# Patient Record
Sex: Female | Born: 1985 | Race: White | Hispanic: No | Marital: Single | Smoking: Current every day smoker
Health system: Southern US, Community
[De-identification: ages and names within clinical notes are randomized; demographics above are authoritative.]

## PROBLEM LIST (undated history)

## (undated) DIAGNOSIS — I38 Endocarditis, valve unspecified: Secondary | ICD-10-CM

## (undated) DIAGNOSIS — F419 Anxiety disorder, unspecified: Secondary | ICD-10-CM

## (undated) DIAGNOSIS — F32A Depression, unspecified: Secondary | ICD-10-CM

## (undated) DIAGNOSIS — F329 Major depressive disorder, single episode, unspecified: Secondary | ICD-10-CM

## (undated) HISTORY — PX: MOUTH SURGERY: SHX715

## (undated) HISTORY — DX: Endocarditis, valve unspecified: I38

## (undated) HISTORY — DX: Anxiety disorder, unspecified: F41.9

---
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
## 2006-11-16 ENCOUNTER — Other Ambulatory Visit: Admission: RE | Admit: 2006-11-16 | Discharge: 2006-11-16 | Payer: Self-pay | Admitting: Internal Medicine

## 2007-05-12 ENCOUNTER — Emergency Department (HOSPITAL_COMMUNITY): Admission: EM | Admit: 2007-05-12 | Discharge: 2007-05-12 | Payer: Self-pay | Admitting: Emergency Medicine

## 2008-04-14 ENCOUNTER — Other Ambulatory Visit: Admission: RE | Admit: 2008-04-14 | Discharge: 2008-04-14 | Payer: Self-pay | Admitting: Obstetrics and Gynecology

## 2008-04-23 ENCOUNTER — Emergency Department (HOSPITAL_COMMUNITY): Admission: EM | Admit: 2008-04-23 | Discharge: 2008-04-23 | Payer: Self-pay | Admitting: Emergency Medicine

## 2008-11-23 ENCOUNTER — Emergency Department (HOSPITAL_COMMUNITY): Admission: EM | Admit: 2008-11-23 | Discharge: 2008-11-23 | Payer: Self-pay | Admitting: Emergency Medicine

## 2008-11-29 ENCOUNTER — Ambulatory Visit: Payer: Self-pay | Admitting: Obstetrics and Gynecology

## 2008-11-29 ENCOUNTER — Inpatient Hospital Stay (HOSPITAL_COMMUNITY): Admission: AD | Admit: 2008-11-29 | Discharge: 2008-12-02 | Payer: Self-pay | Admitting: Family Medicine

## 2010-01-09 ENCOUNTER — Emergency Department (HOSPITAL_COMMUNITY): Admission: EM | Admit: 2010-01-09 | Discharge: 2010-01-09 | Payer: Self-pay | Admitting: Emergency Medicine

## 2010-08-06 ENCOUNTER — Encounter: Payer: Self-pay | Admitting: General Surgery

## 2010-10-02 LAB — URINE MICROSCOPIC-ADD ON

## 2010-10-02 LAB — URINALYSIS, ROUTINE W REFLEX MICROSCOPIC
Bilirubin Urine: NEGATIVE
Glucose, UA: NEGATIVE mg/dL
Nitrite: NEGATIVE
Protein, ur: 100 mg/dL — AB
Specific Gravity, Urine: >1.03 — ABNORMAL HIGH (ref 1.005–1.030)
Urobilinogen, UA: 0.2 mg/dL (ref 0.0–1.0)
pH: 6.5 (ref 5.0–8.0)

## 2010-10-02 LAB — PREGNANCY, URINE: Preg Test, Ur: POSITIVE

## 2010-10-25 LAB — CBC
HCT: 21.8 % — ABNORMAL LOW (ref 36.0–46.0)
HCT: 34.8 % — ABNORMAL LOW (ref 36.0–46.0)
Hemoglobin: 12.2 g/dL (ref 12.0–15.0)
Hemoglobin: 7.7 g/dL — CL (ref 12.0–15.0)
MCHC: 35.1 g/dL (ref 30.0–36.0)
MCHC: 35.2 g/dL (ref 30.0–36.0)
MCV: 93.8 fL (ref 78.0–100.0)
MCV: 94.6 fL (ref 78.0–100.0)
Platelets: 252 10*3/uL (ref 150–400)
Platelets: 372 10*3/uL (ref 150–400)
RBC: 2.31 MIL/uL — ABNORMAL LOW (ref 3.87–5.11)
RBC: 3.71 MIL/uL — ABNORMAL LOW (ref 3.87–5.11)
RDW: 12.7 % (ref 11.5–15.5)
RDW: 13.1 % (ref 11.5–15.5)
WBC: 18.1 10*3/uL — ABNORMAL HIGH (ref 4.0–10.5)
WBC: 18.9 10*3/uL — ABNORMAL HIGH (ref 4.0–10.5)

## 2010-10-25 LAB — RPR: RPR Ser Ql: NONREACTIVE

## 2011-03-14 ENCOUNTER — Emergency Department (HOSPITAL_COMMUNITY)
Admission: EM | Admit: 2011-03-14 | Discharge: 2011-03-14 | Disposition: A | Discharge status: Home / Self Care | Payer: Self-pay | Attending: Emergency Medicine | Admitting: Emergency Medicine

## 2011-03-14 DIAGNOSIS — R319 Hematuria, unspecified: Secondary | ICD-10-CM | POA: Insufficient documentation

## 2011-03-14 DIAGNOSIS — N3000 Acute cystitis without hematuria: Secondary | ICD-10-CM | POA: Insufficient documentation

## 2011-03-14 DIAGNOSIS — F17200 Nicotine dependence, unspecified, uncomplicated: Secondary | ICD-10-CM | POA: Insufficient documentation

## 2011-03-14 LAB — URINALYSIS, ROUTINE W REFLEX MICROSCOPIC
Bilirubin Urine: NEGATIVE
Ketones, ur: NEGATIVE mg/dL
Nitrite: NEGATIVE
Specific Gravity, Urine: >1.03 — ABNORMAL HIGH (ref 1.005–1.030)
pH: 6.5 (ref 5.0–8.0)

## 2011-03-14 LAB — URINE MICROSCOPIC-ADD ON

## 2011-03-14 MED ORDER — PHENAZOPYRIDINE HCL 95 MG PO TABS: ORAL_TABLET | ORAL | Status: DC

## 2011-03-14 MED ORDER — CEPHALEXIN 500 MG PO CAPS
500.0000 mg | ORAL_CAPSULE | Freq: Once | ORAL | Status: AC
  Administered 2011-03-14: 500 mg via ORAL
  Filled 2011-03-14: qty 1

## 2011-03-14 MED ORDER — CEPHALEXIN 500 MG PO CAPS: 500.0000 mg | ORAL_CAPSULE | Freq: Three times a day (TID) | ORAL | Status: AC

## 2011-03-14 NOTE — ED Notes (Signed)
Painful urination

## 2011-03-14 NOTE — ED Provider Notes (Signed)
Scribed for Ward Givens, MD, the patient was seen in room [facility] . This chart was scribed by Ellie Lunch. This patient's care was started at 4:07 PM.   CSN: [id number] Arrival date & time: 03/14/2011  3:57 PM  Chief Complaint  Patient presents with  . Hematuria   HPI GENIFER LAZENBY is a 25 y.o. female who presents to the Emergency Department complaining of  Hematuria and pelvic pain. Pain is described as constant that waxes and wanes. When Pt stands it feels like "her bladder is going to fall out." At it worst the pain feels like a contraction. When she urinates she sees little blood clots. Pt also reports has dysuria, increased frequency, decreased urine volume, difficulty urinating, and urgency. Pt denies n/v, fever, flank or pack pain. There are no other associated symptoms and no other alleviating or aggravating factors.  No PCP.  History reviewed. No pertinent past medical history.  History reviewed. No pertinent past surgical history.  MEDICATIONS:  Previous Medications   ETONOGESTREL (IMPLANON [place])    Inject into the skin.       ALLERGIES:  Allergies as of 03/14/2011 - Review Complete 03/14/2011  Allergen Reaction Noted  . Coconut flavor Itching and Other (See Comments) 03/14/2011  . Hydrocodone Nausea Only 03/14/2011     History reviewed. No pertinent family history.  History  Substance Use Topics  . Smoking status: Current Everyday Smoker  . Smokeless tobacco: Not on file  . Alcohol Use: Yes  employed here at [employer]  OB History    Grav Para Term Preterm Abortions TAB SAB Ect Mult Living                  Review of Systems  Constitutional: Negative for fever.  Gastrointestinal: Negative for nausea, vomiting and diarrhea.  Genitourinary: Positive for dysuria, urgency, frequency, decreased urine volume and difficulty urinating. Negative for flank pain.  Musculoskeletal: Negative for back pain.  All other systems reviewed and are negative.    Physical Exam    BP 109/80  Pulse 95  Temp(Src) 97.5 F (36.4 C) (Oral)  Resp 20  Ht 5\' 5"  (1.651 m)  Wt 150 lb (68.04 kg)  BMI 24.96 kg/m2  SpO2 98%  LMP 01/15/2011  Physical Exam  Constitutional: She appears well-developed and well-nourished.  HENT:  Head: Normocephalic and atraumatic.  Eyes: Conjunctivae and EOM are normal. Pupils are equal, round, and reactive to light.  Neck: Normal range of motion. Neck supple.  Cardiovascular: Normal rate, regular rhythm and normal heart sounds.   Pulmonary/Chest: Effort normal and breath sounds normal.  Abdominal: Soft. There is tenderness in the suprapubic area. There is no rebound, no guarding and no CVA tenderness.  Musculoskeletal:       Pt has no acute abnormality.   Neurological: She is alert. No cranial nerve deficit.  Skin: Skin is warm, dry and intact.  Psychiatric: She has a normal mood and affect. Her speech is normal and behavior is normal.    Procedures  OTHER DATA REVIEWED: Nursing notes, vital signs, and past medical records reviewed.   DIAGNOSTIC STUDIES: Oxygen Saturation is 98% on room air, normal by my interpretation.    LABS:  Results for orders placed during the hospital encounter of 03/14/11  URINALYSIS, ROUTINE W REFLEX MICROSCOPIC      Component Value Range   Color, Urine YELLOW  YELLOW    Appearance HAZY (*) CLEAR    Specific Gravity, Urine >1.030 (*) 1.005 - 1.030  pH 6.5  5.0 - 8.0    Glucose, UA NEGATIVE  NEGATIVE (mg/dL)   Hgb urine dipstick LARGE (*) NEGATIVE    Bilirubin Urine NEGATIVE  NEGATIVE    Ketones, ur NEGATIVE  NEGATIVE (mg/dL)   Protein, ur 161 (*) NEGATIVE (mg/dL)   Urobilinogen, UA 1.0  0.0 - 1.0 (mg/dL)   Nitrite NEGATIVE  NEGATIVE    Leukocytes, UA SMALL (*) NEGATIVE   URINE MICROSCOPIC-ADD ON      Component Value Range   Squamous Epithelial / LPF FEW (*) RARE    WBC, UA 21-50  <3 (WBC/hpf)   RBC / HPF TOO NUMEROUS TO COUNT  <3 (RBC/hpf)   Bacteria, UA MANY (*) RARE    UA cw  hemorrhagic cystitis ED COURSE / COORDINATION OF CARE:  MDM:   IMPRESSION: Diagnoses that have been ruled out:  Diagnoses that are still under consideration:  Final diagnoses:  Acute cystitis  Hematuria    MEDICATIONS GIVEN IN THE E.D.  Medications  phenazopyridine (AZO-TABS) 95 MG tablet (not administered)  cephALEXin (KEFLEX) capsule 500 mg (not administered)  Etonogestrel (IMPLANON [place]) (not administered)  phenazopyridine (AZO-TABS) 95 MG tablet (not administered)  cephALEXin (KEFLEX) 500 MG capsule (not administered)    DISCHARGE MEDICATIONS: New Prescriptions   CEPHALEXIN (KEFLEX) 500 MG CAPSULE    Take 1 capsule (500 mg total) by mouth 3 (three) times daily.      I personally performed the services described in this documentation, which was scribed in my presence. The recorded information has been reviewed and considered.Devoria Albe, MD, Armando Gang    Ward Givens, MD 03/14/11 [phone]

## 2011-04-17 LAB — POCT I-STAT, CHEM 8
BUN: 6
Calcium, Ion: 1.19
Chloride: 103
Creatinine, Ser: 0.7
Glucose, Bld: 82
HCT: 36
Hemoglobin: 12.2
Potassium: 3.8
Sodium: 136
TCO2: 22

## 2011-04-17 LAB — URINALYSIS, ROUTINE W REFLEX MICROSCOPIC
Bilirubin Urine: NEGATIVE
Glucose, UA: NEGATIVE
Hgb urine dipstick: NEGATIVE
Ketones, ur: 15 — AB
Leukocytes, UA: NEGATIVE
Nitrite: NEGATIVE
Protein, ur: 100 — AB
Specific Gravity, Urine: >1.03 — ABNORMAL HIGH
Urobilinogen, UA: 0.2
pH: 6

## 2011-04-17 LAB — URINE MICROSCOPIC-ADD ON

## 2011-04-26 LAB — CBC
HCT: 36.9
Hemoglobin: 12.7
MCHC: 34.4
MCV: 90
Platelets: 244
RBC: 4.1
RDW: 12.8
WBC: 8.9

## 2011-04-26 LAB — PREGNANCY, URINE: Preg Test, Ur: NEGATIVE

## 2011-04-26 LAB — URINALYSIS, ROUTINE W REFLEX MICROSCOPIC
Bilirubin Urine: NEGATIVE
Glucose, UA: NEGATIVE
Ketones, ur: NEGATIVE
Leukocytes, UA: NEGATIVE
Nitrite: NEGATIVE
Protein, ur: NEGATIVE
Specific Gravity, Urine: <1.005 — ABNORMAL LOW
Urobilinogen, UA: 0.2
pH: 5.5

## 2011-04-26 LAB — DIFFERENTIAL
Basophils Absolute: 0
Basophils Relative: 0
Eosinophils Absolute: 0
Eosinophils Relative: 0
Lymphocytes Relative: 22
Lymphs Abs: 2
Monocytes Absolute: 0.3
Monocytes Relative: 4
Neutro Abs: 6.6
Neutrophils Relative %: 74

## 2011-04-26 LAB — RAPID URINE DRUG SCREEN, HOSP PERFORMED
Amphetamines: NOT DETECTED
Barbiturates: NOT DETECTED
Benzodiazepines: NOT DETECTED
Cocaine: POSITIVE — AB
Opiates: NOT DETECTED
Tetrahydrocannabinol: POSITIVE — AB

## 2011-04-26 LAB — URINE MICROSCOPIC-ADD ON

## 2011-04-26 LAB — COMPREHENSIVE METABOLIC PANEL
ALT: 23
AST: 27
Albumin: 3.8
Alkaline Phosphatase: 50
BUN: 6
CO2: 27
Calcium: 8.9
Chloride: 114 — ABNORMAL HIGH
Creatinine, Ser: 0.58
GFR calc Af Amer: >60
GFR calc non Af Amer: >60
Glucose, Bld: 115 — ABNORMAL HIGH
Potassium: 3.4 — ABNORMAL LOW
Sodium: 145
Total Bilirubin: 0.5
Total Protein: 6.3

## 2011-04-26 LAB — ETHANOL: Alcohol, Ethyl (B): 278 — ABNORMAL HIGH

## 2011-05-23 ENCOUNTER — Encounter (HOSPITAL_COMMUNITY): Payer: Self-pay | Admitting: Emergency Medicine

## 2011-05-23 ENCOUNTER — Emergency Department (HOSPITAL_COMMUNITY)
Admission: EM | Admit: 2011-05-23 | Discharge: 2011-05-23 | Disposition: A | Discharge status: Home / Self Care | Payer: Self-pay | Attending: Emergency Medicine | Admitting: Emergency Medicine

## 2011-05-23 DIAGNOSIS — K029 Dental caries, unspecified: Secondary | ICD-10-CM | POA: Insufficient documentation

## 2011-05-23 DIAGNOSIS — K047 Periapical abscess without sinus: Secondary | ICD-10-CM

## 2011-05-23 DIAGNOSIS — F17200 Nicotine dependence, unspecified, uncomplicated: Secondary | ICD-10-CM | POA: Insufficient documentation

## 2011-05-23 MED ORDER — AMOXICILLIN 250 MG PO CAPS
500.0000 mg | ORAL_CAPSULE | Freq: Once | ORAL | Status: AC
  Administered 2011-05-23: 500 mg via ORAL
  Filled 2011-05-23: qty 2

## 2011-05-23 MED ORDER — IBUPROFEN 800 MG PO TABS
800.0000 mg | ORAL_TABLET | Freq: Once | ORAL | Status: AC
  Administered 2011-05-23: 800 mg via ORAL
  Filled 2011-05-23: qty 1

## 2011-05-23 MED ORDER — AMOXICILLIN 500 MG PO CAPS: 500.0000 mg | ORAL_CAPSULE | Freq: Three times a day (TID) | ORAL | Status: AC

## 2011-05-23 MED ORDER — IBUPROFEN 800 MG PO TABS: 800.0000 mg | ORAL_TABLET | Freq: Three times a day (TID) | ORAL | Status: AC | PRN

## 2011-05-23 NOTE — ED Provider Notes (Signed)
History     CSN: [id number] Arrival date & time: 05/23/2011  2:36 PM   First MD Initiated Contact with Patient 05/23/11 1457      Chief Complaint  Patient presents with  . Dental Pain    (Consider location/radiation/quality/duration/timing/severity/associated sxs/prior treatment) Patient is a 25 y.o. female presenting with tooth pain. The history is provided by the patient.  Dental PainThe primary symptoms include mouth pain. Primary symptoms do not include headaches, fever, shortness of breath or sore throat. The symptoms began 3 to 5 days ago. The symptoms are worsening. The symptoms occur constantly.  Additional symptoms include: dental sensitivity to temperature, gum swelling and gum tenderness.    History reviewed. No pertinent past medical history.  Past Surgical History  Procedure Date  . Mouth surgery     Family History  Problem Relation Age of Onset  . Cancer Other     History  Substance Use Topics  . Smoking status: Current Everyday Smoker -- 0.5 packs/day for 5 years    Types: Cigarettes  . Smokeless tobacco: Never Used  . Alcohol Use: No    OB History    Grav Para Term Preterm Abortions TAB SAB Ect Mult Living   2 2 2       2       Review of Systems  Constitutional: Negative for fever.  HENT: Positive for dental problem. Negative for congestion, sore throat and neck pain.   Eyes: Negative.   Respiratory: Negative for chest tightness and shortness of breath.   Cardiovascular: Negative for chest pain.  Gastrointestinal: Negative for nausea and abdominal pain.  Genitourinary: Negative.   Musculoskeletal: Negative for joint swelling and arthralgias.  Skin: Negative.  Negative for rash and wound.  Neurological: Negative for dizziness, weakness, light-headedness, numbness and headaches.  Hematological: Negative.   Psychiatric/Behavioral: Negative.     Allergies  Coconut flavor and Hydrocodone  Home Medications   Current Outpatient Rx  Name Route  Sig Dispense Refill  . IBUPROFEN 200 MG PO TABS Oral Take 400 mg by mouth every 6 (six) hours as needed.      . AMOXICILLIN 500 MG PO CAPS Oral Take 1 capsule (500 mg total) by mouth 3 (three) times daily. 30 capsule 0  . IMPLANON [place] Subcutaneous Inject into the skin.      . IBUPROFEN 800 MG PO TABS Oral Take 1 tablet (800 mg total) by mouth every 8 (eight) hours as needed for pain. 20 tablet 0    BP 115/69  Pulse 89  Temp(Src) 98.7 F (37.1 C) (Oral)  Resp 20  Ht 5\' 5"  (1.651 m)  Wt 150 lb (68.04 kg)  BMI 24.96 kg/m2  SpO2 98%  LMP 04/21/2011  Physical Exam  Constitutional: She is oriented to person, place, and time. She appears well-developed and well-nourished. No distress.  HENT:  Head: Normocephalic and atraumatic.  Right Ear: Tympanic membrane and external ear normal.  Left Ear: Tympanic membrane and external ear normal.  Mouth/Throat: Oropharynx is clear and moist and mucous membranes are normal. No oral lesions. Dental caries present. No oropharyngeal exudate, posterior oropharyngeal edema, posterior oropharyngeal erythema or tonsillar abscesses.    Eyes: Conjunctivae are normal.  Neck: Normal range of motion. Neck supple.  Cardiovascular: Normal rate and normal heart sounds.   Pulmonary/Chest: Effort normal.  Abdominal: She exhibits no distension.  Musculoskeletal: Normal range of motion.  Lymphadenopathy:    She has no cervical adenopathy.  Neurological: She is alert and oriented to person, place,  and time.  Skin: Skin is warm and dry. No erythema.  Psychiatric: She has a normal mood and affect.    ED Course  Procedures (including critical care time)  Labs Reviewed - No data to display No results found.   1. Dental abscess       MDM  Dental caries with possible early abscess.  Amoxil,  Ibuprofen.  Dental referral sheet given.        Candis Musa, PA 05/23/11 1822  Candis Musa, PA 05/23/11 Rickey Primus

## 2011-05-23 NOTE — ED Notes (Signed)
Patient c/o intermittent left upper dental pain-progressively worse since yesterday. Patient c/o headache from dental pain.

## 2011-05-26 NOTE — ED Provider Notes (Signed)
Medical screening examination/treatment/procedure(s) were performed by non-physician practitioner and as supervising physician I was immediately available for consultation/collaboration.   Mechelle Pates L Umberto Pavek, MD 05/26/11 0737 

## 2011-10-09 ENCOUNTER — Encounter (HOSPITAL_COMMUNITY): Payer: Self-pay | Admitting: *Deleted

## 2011-10-09 ENCOUNTER — Emergency Department (HOSPITAL_COMMUNITY)
Admission: EM | Admit: 2011-10-09 | Discharge: 2011-10-09 | Disposition: A | Discharge status: Home / Self Care | Payer: Self-pay | Attending: Emergency Medicine | Admitting: Emergency Medicine

## 2011-10-09 DIAGNOSIS — F17200 Nicotine dependence, unspecified, uncomplicated: Secondary | ICD-10-CM | POA: Insufficient documentation

## 2011-10-09 DIAGNOSIS — N390 Urinary tract infection, site not specified: Secondary | ICD-10-CM | POA: Insufficient documentation

## 2011-10-09 LAB — URINALYSIS, ROUTINE W REFLEX MICROSCOPIC
Bilirubin Urine: NEGATIVE
Ketones, ur: 15 mg/dL — AB
Nitrite: NEGATIVE
Protein, ur: 30 mg/dL — AB
Specific Gravity, Urine: 1.015 (ref 1.005–1.030)
Urobilinogen, UA: 0.2 mg/dL (ref 0.0–1.0)

## 2011-10-09 LAB — URINE MICROSCOPIC-ADD ON

## 2011-10-09 MED ORDER — CEPHALEXIN 500 MG PO CAPS: 500.0000 mg | ORAL_CAPSULE | Freq: Four times a day (QID) | ORAL | Status: AC

## 2011-10-09 MED ORDER — CEFTRIAXONE SODIUM 1 G IJ SOLR
1.0000 g | Freq: Once | INTRAMUSCULAR | Status: AC
  Administered 2011-10-09: 1 g via INTRAMUSCULAR
  Filled 2011-10-09: qty 10

## 2011-10-09 MED ORDER — PHENAZOPYRIDINE HCL 95 MG PO TABS: ORAL_TABLET | ORAL | Status: DC

## 2011-10-09 NOTE — Discharge Instructions (Signed)
Please increase water and juices. Please have your urine rechecked in 10 days. Keflex four times daily until all taken. Pyridium three times daily with food.Urinary Tract Infection A urinary tract infection (UTI) is often caused by a germ (bacteria). A UTI is usually helped with medicine (antibiotics) that kills germs. Take all the medicine until it is gone. Do this even if you are feeling better. You are usually better in 7 to 10 days. HOME CARE   Drink enough water and fluids to keep your pee (urine) clear or pale yellow. Drink:   Cranberry juice.   Water.   Avoid:   Caffeine.   Tea.   Bubbly (carbonated) drinks.   Alcohol.   Only take medicine as told by your doctor.   To prevent further infections:   Pee often.   After pooping (bowel movement), women should wipe from front to back. Use each tissue only once.   Pee before and after having sex (intercourse).  Ask your doctor when your test results will be ready. Make sure you follow up and get your test results.  GET HELP RIGHT AWAY IF:   There is very bad back pain or lower belly (abdominal) pain.   You get the chills.   You have a fever.   Your baby is older than 3 months with a rectal temperature of 102 F (38.9 C) or higher.   Your baby is 79 months old or younger with a rectal temperature of 100.4 F (38 C) or higher.   You feel sick to your stomach (nauseous) or throw up (vomit).   There is continued burning with peeing.   Your problems are not better in 3 days. Return sooner if you are getting worse.  MAKE SURE YOU:   Understand these instructions.   Will watch your condition.   Will get help right away if you are not doing well or get worse.  Document Released: 12/20/2007 Document Revised: 06/22/2011 Document Reviewed: 12/20/2007 [facility] Patient Information 2012 [place], [place].

## 2011-10-09 NOTE — ED Notes (Signed)
Pt with no signs of reaction to abx injection.

## 2011-10-09 NOTE — ED Provider Notes (Signed)
History     CSN: [id number]  Arrival date & time 10/09/11  1517   First MD Initiated Contact with Patient 10/09/11 1545      Chief Complaint  Patient presents with  . Urinary Tract Infection    (Consider location/radiation/quality/duration/timing/severity/associated sxs/prior treatment) Patient is a 26 y.o. female presenting with urinary tract infection. The history is provided by the patient.  Urinary Tract Infection This is a new problem. The current episode started in the past 7 days. The problem occurs constantly. The problem has been unchanged. Associated symptoms include urinary symptoms. Pertinent negatives include no abdominal pain, arthralgias, chest pain, coughing, neck pain or vomiting. The symptoms are aggravated by nothing. Treatments tried: AZO. The treatment provided mild relief.    History reviewed. No pertinent past medical history.  Past Surgical History  Procedure Date  . Mouth surgery     Family History  Problem Relation Age of Onset  . Cancer Other     History  Substance Use Topics  . Smoking status: Current Everyday Smoker -- 0.5 packs/day for 5 years    Types: Cigarettes  . Smokeless tobacco: Never Used  . Alcohol Use: No    OB History    Grav Para Term Preterm Abortions TAB SAB Ect Mult Living   2 2 2       2       Review of Systems  Constitutional: Negative for activity change.       All ROS Neg except as noted in HPI  HENT: Negative for nosebleeds and neck pain.   Eyes: Negative for photophobia and discharge.  Respiratory: Negative for cough, shortness of breath and wheezing.   Cardiovascular: Negative for chest pain and palpitations.  Gastrointestinal: Negative for vomiting, abdominal pain and blood in stool.  Genitourinary: Positive for dysuria and flank pain. Negative for frequency and hematuria.  Musculoskeletal: Positive for back pain. Negative for arthralgias.  Skin: Negative.   Neurological: Negative for dizziness, seizures and  speech difficulty.  Psychiatric/Behavioral: Negative for hallucinations and confusion.    Allergies  Coconut flavor and Hydrocodone  Home Medications   Current Outpatient Rx  Name Route Sig Dispense Refill  . IMPLANON [place] Subcutaneous Inject into the skin.      . IBUPROFEN 200 MG PO TABS Oral Take 400 mg by mouth every 6 (six) hours as needed.        BP 105/82  Pulse 89  Temp(Src) 97.6 F (36.4 C) (Oral)  Resp 18  Ht 5\' 5"  (1.651 m)  Wt 140 lb (63.504 kg)  BMI 23.30 kg/m2  SpO2 98%  Physical Exam  Nursing note and vitals reviewed. Constitutional: She is oriented to person, place, and time. She appears well-developed and well-nourished.  Non-toxic appearance.  HENT:  Head: Normocephalic.  Right Ear: Tympanic membrane and external ear normal.  Left Ear: Tympanic membrane and external ear normal.  Eyes: EOM and lids are normal. Pupils are equal, round, and reactive to light.  Neck: Normal range of motion. Neck supple. Carotid bruit is not present.  Cardiovascular: Normal rate, regular rhythm, normal heart sounds, intact distal pulses and normal pulses.   Pulmonary/Chest: Breath sounds normal. No respiratory distress.  Abdominal: Soft. Bowel sounds are normal. There is no tenderness. There is no guarding.       Left CVAT.  Musculoskeletal: Normal range of motion.       Mid lumbar area tenderness to palpation .  Lymphadenopathy:       Head (right side): No submandibular  adenopathy present.       Head (left side): No submandibular adenopathy present.    She has no cervical adenopathy.  Neurological: She is alert and oriented to person, place, and time. She has normal strength. No cranial nerve deficit or sensory deficit.  Skin: Skin is warm and dry.  Psychiatric: She has a normal mood and affect. Her speech is normal.    ED Course  Procedures (including critical care time)   Labs Reviewed  URINALYSIS, ROUTINE W REFLEX MICROSCOPIC  PREGNANCY, URINE   No results  found.   Dx: UTI   MDM  I have reviewed nursing notes, vital signs, and all appropriate lab and imaging results for this patient. Urine preg is negative. UA is pos for UTI. Pt treated with keflex and pyridium. Pt to have urine rechecked in 10 days.       Kathie Dike, [place] 10/09/11 1640

## 2011-10-09 NOTE — ED Notes (Signed)
Pt presents to er with week hx of uti symptoms, lower back pain,

## 2011-10-11 LAB — URINE CULTURE
Colony Count: 50000
Culture  Setup Time: 201303251840

## 2011-10-13 NOTE — ED Provider Notes (Signed)
Medical screening examination/treatment/procedure(s) were performed by non-physician practitioner and as supervising physician I was immediately available for consultation/collaboration.   Shelda Jakes, MD 10/13/11 [phone]

## 2011-11-14 ENCOUNTER — Encounter (HOSPITAL_COMMUNITY): Payer: Self-pay

## 2011-11-14 ENCOUNTER — Emergency Department (HOSPITAL_COMMUNITY)
Admission: EM | Admit: 2011-11-14 | Discharge: 2011-11-14 | Disposition: A | Discharge status: Home / Self Care | Payer: Self-pay | Attending: Emergency Medicine | Admitting: Emergency Medicine

## 2011-11-14 DIAGNOSIS — R259 Unspecified abnormal involuntary movements: Secondary | ICD-10-CM | POA: Insufficient documentation

## 2011-11-14 DIAGNOSIS — K089 Disorder of teeth and supporting structures, unspecified: Secondary | ICD-10-CM | POA: Insufficient documentation

## 2011-11-14 DIAGNOSIS — F17200 Nicotine dependence, unspecified, uncomplicated: Secondary | ICD-10-CM | POA: Insufficient documentation

## 2011-11-14 DIAGNOSIS — K029 Dental caries, unspecified: Secondary | ICD-10-CM | POA: Insufficient documentation

## 2011-11-14 DIAGNOSIS — K0889 Other specified disorders of teeth and supporting structures: Secondary | ICD-10-CM

## 2011-11-14 MED ORDER — TRAMADOL HCL 50 MG PO TABS: 50.0000 mg | ORAL_TABLET | Freq: Four times a day (QID) | ORAL | Status: AC | PRN

## 2011-11-14 MED ORDER — AMOXICILLIN 500 MG PO CAPS: 500.0000 mg | ORAL_CAPSULE | Freq: Three times a day (TID) | ORAL | Status: AC

## 2011-11-14 MED ORDER — OXYCODONE-ACETAMINOPHEN 5-325 MG PO TABS
1.0000 | ORAL_TABLET | Freq: Once | ORAL | Status: AC
  Administered 2011-11-14: 1 via ORAL
  Filled 2011-11-14: qty 1

## 2011-11-14 MED ORDER — AMOXICILLIN 250 MG PO CAPS
500.0000 mg | ORAL_CAPSULE | Freq: Once | ORAL | Status: AC
  Administered 2011-11-14: 500 mg via ORAL
  Filled 2011-11-14: qty 2

## 2011-11-14 NOTE — ED Notes (Signed)
Right lower dental pain x 2 days.

## 2011-11-14 NOTE — ED Provider Notes (Signed)
History     CSN: [id number]  Arrival date & time 11/14/11  2307   First MD Initiated Contact with Patient 11/14/11 2309      Chief Complaint  Patient presents with  . Dental Pain    (Consider location/radiation/quality/duration/timing/severity/associated sxs/prior treatment) Patient is a 26 y.o. female presenting with tooth pain. The history is provided by the patient.  Dental PainThe primary symptoms include mouth pain. Primary symptoms do not include headaches, fever, shortness of breath, sore throat, angioedema or cough. The symptoms began 2 days ago. The symptoms are unchanged. The symptoms are new. The symptoms occur constantly.  Mouth pain began 24 -48 hours ago. Mouth pain occurs constantly. Mouth pain is unchanged. Affected locations include: teeth and gum(s).  Additional symptoms include: dental sensitivity to temperature and gum tenderness. Additional symptoms do not include: gum swelling, purulent gums, trismus, jaw pain, facial swelling, trouble swallowing, pain with swallowing, drooling, ear pain and swollen glands. Medical issues include: smoking and periodontal disease.    History reviewed. No pertinent past medical history.  Past Surgical History  Procedure Date  . Mouth surgery     Family History  Problem Relation Age of Onset  . Cancer Other     History  Substance Use Topics  . Smoking status: Current Everyday Smoker -- 0.5 packs/day for 5 years    Types: Cigarettes  . Smokeless tobacco: Never Used  . Alcohol Use: No    OB History    Grav Para Term Preterm Abortions TAB SAB Ect Mult Living   2 2 2       2       Review of Systems  Constitutional: Negative for fever and appetite change.  HENT: Positive for dental problem. Negative for ear pain, congestion, sore throat, facial swelling, drooling, trouble swallowing, neck pain and neck stiffness.   Eyes: Negative for pain and visual disturbance.  Respiratory: Negative for cough and shortness of breath.    Neurological: Negative for dizziness, facial asymmetry and headaches.  Hematological: Negative for adenopathy.  All other systems reviewed and are negative.    Allergies  Coconut flavor and Hydrocodone  Home Medications   Current Outpatient Rx  Name Route Sig Dispense Refill  . IMPLANON [place] Subcutaneous Inject into the skin.      . IBUPROFEN 200 MG PO TABS Oral Take 400 mg by mouth every 6 (six) hours as needed.      Marland Kitchen PHENAZOPYRIDINE HCL 95 MG PO TABS  1 po tid with food 10 tablet 0  . AZO TABS PO Oral Take 1-2 tablets by mouth as needed. For urinary pain relief      BP 137/75  Pulse 92  Temp 98.2 F (36.8 C)  Resp 18  Ht 5\' 5"  (1.651 m)  Wt 145 lb (65.772 kg)  BMI 24.13 kg/m2  SpO2 100%  Physical Exam  Nursing note and vitals reviewed. Constitutional: She is oriented to person, place, and time. She appears well-developed and well-nourished. No distress.  HENT:  Head: Normocephalic and atraumatic. There is trismus in the jaw.  Right Ear: Tympanic membrane and ear canal normal.  Left Ear: Tympanic membrane and ear canal normal.  Mouth/Throat: Uvula is midline, oropharynx is clear and moist and mucous membranes are normal. Dental caries present. No dental abscesses or uvula swelling.         Dental caries of the right lower third molar.  ttp of the surrounding gums.    Neck: Normal range of motion. Neck supple.  Cardiovascular: Normal rate, regular rhythm and normal heart sounds.   Pulmonary/Chest: Effort normal and breath sounds normal. No respiratory distress.  Musculoskeletal: Normal range of motion.  Lymphadenopathy:    She has no cervical adenopathy.  Neurological: She is alert and oriented to person, place, and time. She exhibits normal muscle tone. Coordination normal.  Skin: Skin is warm and dry.    ED Course  Procedures (including critical care time)       MDM      Previous medical charts, nursing notes and vitals signs from this visit were  reviewed by me   All laboratory results and/or imaging results performed on this visit, if applicable, were reviewed by me and discussed with the patient and/or parent as well as recommendation for follow-up    MEDICATIONS GIVEN IN ED: amoxil, percocet  Dental caries of the right lower third molar.  No obvious dental abscess.  No facial edema.  No trismus. Patient agrees to f/u with a dentist.  I will provide a list of the local dental resources.        PRESCRIPTIONS GIVEN AT DISCHARGE:  Amoxil, tramadol     Pt stable in ED with no significant deterioration in condition. Pt feels improved after observation and/or treatment in ED. Patient / Family / Caregiver understand and agree with initial ED impression and plan with expectations set for ED visit.  Patient agrees to return to ED for any worsening symptoms        Ahmani Prehn L. Margretta Zamorano, [place] 11/14/11 2340

## 2011-11-14 NOTE — Discharge Instructions (Signed)
Dental Pain A tooth ache may be caused by cavities (tooth decay). Cavities expose the nerve of the tooth to air and hot or cold temperatures. It may come from an infection or abscess (also called a boil or furuncle) around your tooth. It is also often caused by dental caries (tooth decay). This causes the pain you are having. DIAGNOSIS  Your caregiver can diagnose this problem by exam. TREATMENT   If caused by an infection, it may be treated with medications which kill germs (antibiotics) and pain medications as prescribed by your caregiver. Take medications as directed.   Only take over-the-counter or prescription medicines for pain, discomfort, or fever as directed by your caregiver.   Whether the tooth ache today is caused by infection or dental disease, you should see your dentist as soon as possible for further care.  SEEK MEDICAL CARE IF: The exam and treatment you received today has been provided on an emergency basis only. This is not a substitute for complete medical or dental care. If your problem worsens or new problems (symptoms) appear, and you are unable to meet with your dentist, call or return to this location. SEEK IMMEDIATE MEDICAL CARE IF:   You have a fever.   You develop redness and swelling of your face, jaw, or neck.   You are unable to open your mouth.   You have severe pain uncontrolled by pain medicine.  MAKE SURE YOU:   Understand these instructions.   Will watch your condition.   Will get help right away if you are not doing well or get worse.  Document Released: 07/03/2005 Document Revised: 06/22/2011 Document Reviewed: 02/19/2008 ExitCare Patient Information 2012 [employer].  RESOURCE GUIDE  Dental Problems  Patients with Medicaid: [facility]                     [facility] [address]                                           [address] Phone:  [phone]                                                   Phone:  [phone]  If unable to pay or uninsured, contact:  [facility] or [facility] to become qualified for the adult dental clinic.  Chronic Pain Problems Contact [facility]  [phone] Patients need to be referred by their primary care doctor.  Insufficient Money for Medicine Contact [employer]:  call "211" or [facility] 271-5999.  No Primary Care Doctor Call [facility]  832-8000 Other agencies that provide inexpensive medical care    [facility]  [phone]    [facility]  [phone]    [facility]  [phone]    [facility]  [phone]    [facility]  [phone]    [facility]  [phone]  Psychological Services [facility]  [phone] [facility]  [phone] [facility]   800 853-5163 (emergency services 641-4993)  Substance Abuse Resources [facility]  336-882-2125 [facility] 336-784-9470 [facility] 336-285-9073 [facility]   336-845-3988 Residential & Outpatient Substance Abuse Program  800-659-3381  Abuse/Neglect [place] [facility] Hotline (336) 641-3795 [place] [facility] Hotline 800-378-5315 (After Hours)  Emergency Shelter [facility] (336) 271-5985  [facility] (336) 275-9566 [facility] (704) 372-4663  MRSA Hotline #:   832-7006    [place] [facility]  [facility] [place] [facility]     [employer]                          [facility] [address] [place]                       [address]      [address]  [place]                                                [place]                            [place] Phone:  [phone]                                   Phone:  [phone]                 Phone:  [phone]  [facility] Phone:   [phone]  [place] Child Abuse Hotline (336) 342-1394 (336) 342-3537 (After Hours)   

## 2011-11-15 NOTE — ED Provider Notes (Signed)
Medical screening examination/treatment/procedure(s) were performed by non-physician practitioner and as supervising physician I was immediately available for consultation/collaboration.  Sunnie Nielsen, MD 11/15/11 [phone]

## 2011-12-14 ENCOUNTER — Emergency Department (HOSPITAL_COMMUNITY)
Admission: EM | Admit: 2011-12-14 | Discharge: 2011-12-15 | Disposition: A | Discharge status: Home / Self Care | Payer: Self-pay | Attending: Emergency Medicine | Admitting: Emergency Medicine

## 2011-12-14 ENCOUNTER — Encounter (HOSPITAL_COMMUNITY): Payer: Self-pay | Admitting: *Deleted

## 2011-12-14 DIAGNOSIS — K029 Dental caries, unspecified: Secondary | ICD-10-CM | POA: Insufficient documentation

## 2011-12-14 DIAGNOSIS — K089 Disorder of teeth and supporting structures, unspecified: Secondary | ICD-10-CM | POA: Insufficient documentation

## 2011-12-14 DIAGNOSIS — K0889 Other specified disorders of teeth and supporting structures: Secondary | ICD-10-CM

## 2011-12-14 NOTE — ED Notes (Signed)
Pt has three teeth that are causing problems, lt upper, rt upper, rt lower. Pt stated pain has been going on for two months, pt stated "I was eating today and broke a tooth on my lt upper.

## 2011-12-15 MED ORDER — OXYCODONE-ACETAMINOPHEN 5-325 MG PO TABS
1.0000 | ORAL_TABLET | Freq: Once | ORAL | Status: AC
  Administered 2011-12-15: 1 via ORAL
  Filled 2011-12-15: qty 1

## 2011-12-15 MED ORDER — PENICILLIN V POTASSIUM 250 MG PO TABS
500.0000 mg | ORAL_TABLET | Freq: Once | ORAL | Status: AC
Start: 2011-12-15 — End: 2011-12-15
  Administered 2011-12-15: 500 mg via ORAL
  Filled 2011-12-15: qty 2

## 2011-12-15 MED ORDER — IBUPROFEN 400 MG PO TABS
400.0000 mg | ORAL_TABLET | Freq: Once | ORAL | Status: AC
  Administered 2011-12-15: 400 mg via ORAL
  Filled 2011-12-15: qty 1

## 2011-12-15 MED ORDER — PENICILLIN V POTASSIUM 500 MG PO TABS: 500.0000 mg | ORAL_TABLET | Freq: Four times a day (QID) | ORAL | Status: AC

## 2011-12-15 MED ORDER — OXYCODONE-ACETAMINOPHEN 5-325 MG PO TABS: ORAL_TABLET | ORAL | Status: DC

## 2011-12-15 NOTE — Discharge Instructions (Signed)
Dental Pain A tooth ache may be caused by cavities (tooth decay). Cavities expose the nerve of the tooth to air and hot or cold temperatures. It may come from an infection or abscess (also called a boil or furuncle) around your tooth. It is also often caused by dental caries (tooth decay). This causes the pain you are having. DIAGNOSIS  Your caregiver can diagnose this problem by exam. TREATMENT   If caused by an infection, it may be treated with medications which kill germs (antibiotics) and pain medications as prescribed by your caregiver. Take medications as directed.   Only take over-the-counter or prescription medicines for pain, discomfort, or fever as directed by your caregiver.   Whether the tooth ache today is caused by infection or dental disease, you should see your dentist as soon as possible for further care.  SEEK MEDICAL CARE IF: The exam and treatment you received today has been provided on an emergency basis only. This is not a substitute for complete medical or dental care. If your problem worsens or new problems (symptoms) appear, and you are unable to meet with your dentist, call or return to this location. SEEK IMMEDIATE MEDICAL CARE IF:   You have a fever.   You develop redness and swelling of your face, jaw, or neck.   You are unable to open your mouth.   You have severe pain uncontrolled by pain medicine.  MAKE SURE YOU:   Understand these instructions.   Will watch your condition.   Will get help right away if you are not doing well or get worse.  Document Released: 07/03/2005 Document Revised: 06/22/2011 Document Reviewed: 02/19/2008 ExitCare Patient Information 2012 [employer].   Take the meds as directed.  Take ibuprofen 800 mg every 8 hrs with food.  Follow up with the dentist of your choice ASAP. 

## 2011-12-15 NOTE — ED Notes (Signed)
Discharge instructions reviewed with pt, questions answered. Pt verbalized understanding.  

## 2011-12-15 NOTE — ED Provider Notes (Signed)
Medical screening examination/treatment/procedure(s) were performed by non-physician practitioner and as supervising physician I was immediately available for consultation/collaboration.   Benny Lennert, MD 12/15/11 [phone]

## 2011-12-15 NOTE — ED Provider Notes (Signed)
History     CSN: [id number]  Arrival date & time 12/14/11  2211   First MD Initiated Contact with Patient 12/14/11 2335      Chief Complaint  Patient presents with  . Dental Pain    (Consider location/radiation/quality/duration/timing/severity/associated sxs/prior treatment) HPI Comments: Plugged in with the dental clinic but "they can't see me for a while".  No fever or chills.  Patient is a 26 y.o. female presenting with tooth pain. The history is provided by the patient. No language interpreter was used.  Dental PainThe primary symptoms include mouth pain. Primary symptoms do not include dental injury, fever or sore throat. Episode onset: started 6 months ago but acutely worse in past few days. The symptoms are worsening. The symptoms occur constantly.  Additional symptoms do not include: purulent gums and trismus.    History reviewed. No pertinent past medical history.  Past Surgical History  Procedure Date  . Mouth surgery     Family History  Problem Relation Age of Onset  . Cancer Other     History  Substance Use Topics  . Smoking status: Current Everyday Smoker -- 0.5 packs/day for 5 years    Types: Cigarettes  . Smokeless tobacco: Never Used  . Alcohol Use: No    OB History    Grav Para Term Preterm Abortions TAB SAB Ect Mult Living   2 2 2       2       Review of Systems  Constitutional: Negative for fever.  HENT: Positive for dental problem. Negative for sore throat.   All other systems reviewed and are negative.    Allergies  Coconut flavor and Hydrocodone  Home Medications   Current Outpatient Rx  Name Route Sig Dispense Refill  . IBUPROFEN 200 MG PO TABS Oral Take 400 mg by mouth every 6 (six) hours as needed.      Marland Kitchen PHENAZOPYRIDINE HCL 95 MG PO TABS  1 po tid with food 10 tablet 0  . AZO TABS PO Oral Take 1-2 tablets by mouth as needed. For urinary pain relief    . IMPLANON [place] Subcutaneous Inject into the skin.      .  OXYCODONE-ACETAMINOPHEN 5-325 MG PO TABS  One tab po q 6 hrs prn pain 20 tablet 0  . PENICILLIN V POTASSIUM 500 MG PO TABS Oral Take 1 tablet (500 mg total) by mouth 4 (four) times daily. 40 tablet 0    BP 107/58  Pulse 101  Temp(Src) 97.6 F (36.4 C) (Oral)  Resp 20  Ht 5\' 5"  (1.651 m)  Wt 150 lb (68.04 kg)  BMI 24.96 kg/m2  SpO2 99%  Physical Exam  Nursing note and vitals reviewed. Constitutional: She is oriented to person, place, and time. She appears well-developed and well-nourished. No distress.  HENT:  Head: Normocephalic and atraumatic.  Mouth/Throat: Uvula is midline, oropharynx is clear and moist and mucous membranes are normal. Dental caries present. No dental abscesses.    Eyes: EOM are normal.  Neck: Normal range of motion.  Cardiovascular: Normal rate, regular rhythm and normal heart sounds.   Pulmonary/Chest: Effort normal and breath sounds normal.  Abdominal: Soft. She exhibits no distension. There is no tenderness.  Musculoskeletal: Normal range of motion.  Neurological: She is alert and oriented to person, place, and time.  Skin: Skin is warm and dry.  Psychiatric: She has a normal mood and affect. Judgment normal.    ED Course  Procedures (including critical care time)  Labs  Reviewed - No data to display No results found.   1. Pain, dental       MDM  No obvious abscess rx-pen VK 500 mg , QID, 40 rx-percocet, 20 OTC ibuprofen 800 mg TID with food. F/u with dentist of choice.        Worthy Rancher, PA 12/15/11 [place]ers

## 2012-01-02 ENCOUNTER — Encounter (HOSPITAL_COMMUNITY): Payer: Self-pay

## 2012-01-02 ENCOUNTER — Emergency Department (HOSPITAL_COMMUNITY)
Admission: EM | Admit: 2012-01-02 | Discharge: 2012-01-02 | Disposition: A | Discharge status: Home / Self Care | Payer: Self-pay | Attending: Emergency Medicine | Admitting: Emergency Medicine

## 2012-01-02 DIAGNOSIS — S025XXA Fracture of tooth (traumatic), initial encounter for closed fracture: Secondary | ICD-10-CM | POA: Insufficient documentation

## 2012-01-02 DIAGNOSIS — F17200 Nicotine dependence, unspecified, uncomplicated: Secondary | ICD-10-CM | POA: Insufficient documentation

## 2012-01-02 DIAGNOSIS — K047 Periapical abscess without sinus: Secondary | ICD-10-CM | POA: Insufficient documentation

## 2012-01-02 DIAGNOSIS — X58XXXA Exposure to other specified factors, initial encounter: Secondary | ICD-10-CM | POA: Insufficient documentation

## 2012-01-02 DIAGNOSIS — K029 Dental caries, unspecified: Secondary | ICD-10-CM | POA: Insufficient documentation

## 2012-01-02 MED ORDER — OXYCODONE-ACETAMINOPHEN 5-325 MG PO TABS: 1.0000 | ORAL_TABLET | ORAL | Status: AC | PRN

## 2012-01-02 MED ORDER — OXYCODONE-ACETAMINOPHEN 5-325 MG PO TABS
1.0000 | ORAL_TABLET | Freq: Once | ORAL | Status: AC
  Administered 2012-01-02: 1 via ORAL
  Filled 2012-01-02: qty 1

## 2012-01-02 NOTE — Discharge Instructions (Signed)
Dental Extraction   A dental extraction procedure refers to a routine tooth extraction performed by your dentist. The procedure depends on where and how the tooth is positioned. The procedure can be very quick, sometimes lasting only seconds. Reasons for dental extraction include:   Tooth decay.   Infections (abcesses).   The need to make room for other teeth.   Gum disease s where the supporting bone has been destroyed.   Fractures of the tooth leaving it unrestorable.   Extra teeth (supernumerary) or grossly malformed teeth.   Baby teeth that have not fallen out in time and have not permitted the the permanent teeth to erupt properly.   In preparation for braces where there is not enough room to align the teeth properly.   Not enough room for wisdom teeth (particularly those that are impacted).   Prior to receiving radiation to the head and neck, teeth in the field of radiation may need to be extracted.   LET YOUR CAREGIVER KNOW ABOUT:   Any allergies.   All medicines you are taking:   Including herbs, eye drops, over-the-counter medications, and creams.   Blood thinners (anticoagulants), aspirin or other drugs that may affect blood clotting.   Use of steroids (through mouth or as creams).   Previous problems with anesthetics, including local anesthetics.   History of bleeding or blood problems.   Previous surgery.   Possibility of pregnancy if this applies.   Smoking history.   Any health problems.   RISKS AND COMPLICATIONS   As with any procedure, complications may occur, but they can usually be managed by your caregiver. General surgical complications may include:   Reaction to anesthesia.   Damage to surrounding teeth, nerves, tissues, or structures.   Infection.   Bleeding.   With appropriate treatment and care after surgery, the following complications are very uncommon:   Dry socket (blood clot does not form or stay in place over empty socket). This can delay healing.   Incomplete extraction of roots.    Jawbone injury, pain, or weakness.   BEFORE THE PROCEDURE   Your dental care provider will:   Take a medical and dental history.   Take an X-ray to evaluate the circumstances and how to best extract the tooth.   Do an oral exam.   Depending on the situation, antibiotics may be given before or after the extraction, or before and after.   Your caregivers may review the procedure, the local anaesthesia and/or sedation being used, and what to expect after the procedure with you.   If needed, your dentist may give you a form of sedation, either by medicine you swallow, gas, or intravenously (IV). This will help to relieve anxiety. Complicated extractions may require the use of general anaesthesia.   It is important to follow your caregiver's instructions prior to your procedure to avoid complications. Steps before your procedure may include:   Alert your caregiver if you feel ill (sore throat, fever, upset stomach, etc.) in the days leading up to your procedure.   Stop taking certain medications for several days prior to your procedure such as blood thinners.   Take certain medications, such as antibiotics.   Avoid eating and drinking for several hours before the procedure. This will help you to avoid complications from the sedation or anaesthesia.   Sign a patient consent form.   Have a friend or family member drive you to the dentist and drive you home after the procedure.     this will raise the chances of a healing problem after your procedure. If you are thinking about quitting, talk to your surgeon about how long before the operation you should stop smoking. You may also get help from your primary caregiver.  PROCEDURE Dental extraction is typically done as an outpatient procedure. IV sedation, local anesthesia, or both may be used. It will  keep you comfortable and free of pain during the procedure.  There are 2 types of extractions:  Simple extraction involves a tooth that is visible in the mouth and above the gum line. After local anesthetic is given by injection, and the area is numbed, the dentist will loosen the tooth with a special instrument (elevator). Then another instrument (forceps) will be used to grasp the tooth and remove it from its socket. During the procedure you will feel some pressure, but you should not feel pain. If you do feel pain, tell your dentist. The open socket will be cleaned. Dressings (gauze) will be placed in the socket to reduce bleeding.   Surgical extractions are used if the tooth has not come into the mouth or the tooth is broken off below the gum line. The dentist will make a cut (incision) in the gum and may have to remove some of the bone around the tooth to aid in the removal of the tooth. After removal, stitches (sutures) may be required to close area to help in healing and control bleeding. For some surgical extractions, you may need a general anesthetic or IV sedation (through the vein).  After both types of extractions, you may be given pain medication or other drugs to help healing. Other post operative instructions will be given by your dental caregiver. AFTER THE PROCEDURE  You will have gauze in your mouth where the tooth was removed. Gentle pressure on the gauze for up to 1 hour will help to control bleeding.   A blood clot will begin to form over the open socket. This is normal. Do not touch the area or rinse it.   Your pain will be controlled with medication and self-care.   You will be given detailed instructions for care after surgery.  PROGNOSIS While some discomfort is normal after tooth extraction, most patients recover fully in just a few days. SEEK IMMEDIATE DENTAL CARE  You have uncontrolled bleeding, marked swelling, or severe pain.   You develop a fever, difficulty  swallowing, or other severe symptoms.   You have questions or concerns.  Document Released: 07/03/2005 Document Revised: 06/22/2011 Document Reviewed: 10/07/2010 [facility] Patient Information 2012 [place], [place].\   Call Dr. Blondell Reveal  As discussed as he may be able to refer you to an oral surgeon who would be more cost effective for you.  Also,  As discussed,  You may consider calling [facility] at [phone].   Additionally,  [facility] a dental school that does routinely accept new patients.  You may contact them by calling [phone].  You may take the oxycodone prescribed for pain relief.  This will make you drowsy - do not drive within 4 hours of taking this medication.

## 2012-01-02 NOTE — ED Notes (Signed)
Pt reports dental pain off and on for the past few months.  Reports pt has gotten worse over the past few days.

## 2012-01-02 NOTE — ED Notes (Signed)
Dental pain. 

## 2012-01-02 NOTE — ED Provider Notes (Signed)
History     CSN: [id number]  Arrival date & time 01/02/12  1551   First MD Initiated Contact with Patient 01/02/12 1559      Chief Complaint  Patient presents with  . Dental Pain    (Consider location/radiation/quality/duration/timing/severity/associated sxs/prior treatment) HPI Comments: Brenda Keller presents with acute on chronic dental pain.  She has 3 teeth that are in various stages of decay and/or have fractured that require extraction.  She has been referred to an oral surgeon by her dentist Dr. Blondell Reveal.  However,  She is unable to afford her appointment with the oral surgeon.  She was recently seen here for the same dental pain,  And completed a course of antiobiotics about 1 week ago.  She has persistent throbbing pain and having difficulty eating anything but the softest foods.  She denies fever,  Chills and gingival swelling.  She has tried oragel without relief.  Patient is a 26 y.o. female presenting with tooth pain. The history is provided by the patient.  Dental PainPrimary symptoms do not include fever, shortness of breath or sore throat.  Additional symptoms do not include: facial swelling.    History reviewed. No pertinent past medical history.  Past Surgical History  Procedure Date  . Mouth surgery     Family History  Problem Relation Age of Onset  . Cancer Other     History  Substance Use Topics  . Smoking status: Current Everyday Smoker -- 0.5 packs/day for 5 years    Types: Cigarettes  . Smokeless tobacco: Never Used  . Alcohol Use: No    OB History    Grav Para Term Preterm Abortions TAB SAB Ect Mult Living   2 2 2       2       Review of Systems  Constitutional: Negative for fever.  HENT: Positive for dental problem. Negative for sore throat, facial swelling, neck pain and neck stiffness.   Respiratory: Negative for shortness of breath.     Allergies  Coconut flavor and Hydrocodone  Home Medications   Current Outpatient Rx  Name  Route Sig Dispense Refill  . IMPLANON [place] Subcutaneous Inject into the skin.      . IBUPROFEN 200 MG PO TABS Oral Take 400 mg by mouth every 6 (six) hours as needed.      . OXYCODONE-ACETAMINOPHEN 5-325 MG PO TABS  One tab po q 6 hrs prn pain 20 tablet 0  . OXYCODONE-ACETAMINOPHEN 5-325 MG PO TABS Oral Take 1 tablet by mouth every 4 (four) hours as needed for pain. 20 tablet 0  . PHENAZOPYRIDINE HCL 95 MG PO TABS  1 po tid with food 10 tablet 0  . AZO TABS PO Oral Take 1-2 tablets by mouth as needed. For urinary pain relief      BP 122/65  Pulse 88  Temp 98.1 F (36.7 C) (Oral)  Resp 18  Ht 5\' 5"  (1.651 m)  Wt 149 lb (67.586 kg)  BMI 24.79 kg/m2  SpO2 100%  Physical Exam  Constitutional: She is oriented to person, place, and time. She appears well-developed and well-nourished. No distress.  HENT:  Head: Normocephalic and atraumatic.  Right Ear: Tympanic membrane and external ear normal.  Left Ear: Tympanic membrane and external ear normal.  Mouth/Throat: Oropharynx is clear and moist and mucous membranes are normal. No oral lesions. Dental abscesses present.    Eyes: Conjunctivae are normal.  Neck: Normal range of motion. Neck supple.  Cardiovascular: Normal rate  and normal heart sounds.   Pulmonary/Chest: Effort normal.  Abdominal: She exhibits no distension.  Musculoskeletal: Normal range of motion.  Lymphadenopathy:    She has no cervical adenopathy.  Neurological: She is alert and oriented to person, place, and time.  Skin: Skin is warm and dry. No erythema.  Psychiatric: She has a normal mood and affect.    ED Course  Procedures (including critical care time)  Labs Reviewed - No data to display No results found.   1. Broken tooth       MDM  Pt prescribed oxycodone,  Encouraged ibuprofen. Advised to call Dr. Blondell Reveal for advise about having teeth extracted,  Also given referrals to the [facility] clinic,  May also consider trying [facility]  -  phone #s given.        Burgess Amor, [place] 01/02/12 2134

## 2012-01-04 NOTE — ED Provider Notes (Signed)
Medical screening examination/treatment/procedure(s) were performed by non-physician practitioner and as supervising physician I was immediately available for consultation/collaboration.   Shelda Jakes, MD 01/04/12 [phone]

## 2012-01-14 ENCOUNTER — Encounter (HOSPITAL_COMMUNITY): Payer: Self-pay | Admitting: *Deleted

## 2012-01-14 DIAGNOSIS — X58XXXA Exposure to other specified factors, initial encounter: Secondary | ICD-10-CM | POA: Insufficient documentation

## 2012-01-14 DIAGNOSIS — F17200 Nicotine dependence, unspecified, uncomplicated: Secondary | ICD-10-CM | POA: Insufficient documentation

## 2012-01-14 DIAGNOSIS — K029 Dental caries, unspecified: Secondary | ICD-10-CM | POA: Insufficient documentation

## 2012-01-14 DIAGNOSIS — S025XXA Fracture of tooth (traumatic), initial encounter for closed fracture: Secondary | ICD-10-CM | POA: Insufficient documentation

## 2012-01-14 NOTE — ED Notes (Signed)
Pt c/o pain to a tooth on her upper left and lower right side of her mouth

## 2012-01-15 ENCOUNTER — Emergency Department (HOSPITAL_COMMUNITY)
Admission: EM | Admit: 2012-01-15 | Discharge: 2012-01-15 | Disposition: A | Discharge status: Home / Self Care | Payer: Self-pay | Attending: Emergency Medicine | Admitting: Emergency Medicine

## 2012-01-15 DIAGNOSIS — K0889 Other specified disorders of teeth and supporting structures: Secondary | ICD-10-CM

## 2012-01-15 MED ORDER — HYDROCODONE-ACETAMINOPHEN 5-325 MG PO TABS: 1.0000 | ORAL_TABLET | Freq: Once | ORAL | Status: DC

## 2012-01-15 MED ORDER — OXYCODONE-ACETAMINOPHEN 5-325 MG PO TABS
2.0000 | ORAL_TABLET | Freq: Once | ORAL | Status: AC
  Administered 2012-01-15: 2 via ORAL
  Filled 2012-01-15: qty 2

## 2012-01-15 MED ORDER — IBUPROFEN 800 MG PO TABS
800.0000 mg | ORAL_TABLET | Freq: Once | ORAL | Status: AC
  Administered 2012-01-15: 800 mg via ORAL
  Filled 2012-01-15: qty 1

## 2012-01-15 MED ORDER — OXYCODONE-ACETAMINOPHEN 5-325 MG PO TABS: 1.0000 | ORAL_TABLET | ORAL | Status: AC | PRN

## 2012-01-15 NOTE — ED Provider Notes (Signed)
History     CSN: [id number]  Arrival date & time 01/14/12  2314   First MD Initiated Contact with Patient 01/15/12 0113      No chief complaint on file.   (Consider location/radiation/quality/duration/timing/severity/associated sxs/prior treatment) HPI Brenda Keller is a 26 y.o. female who presents to the Emergency Department complaining of recurrent acute on chronic dental pain. Patient has poor dentition and has several teeth causing pain. Her dentist, Dr. Blondell Reveal had referred her to a oral surgeon she could not afford. She has since found a dentist who will extract the teeth in two weeks. She has completed a course of antibiotics recently. She is here having finished oxycodone given her previously. Has used ibuprofen without relief.   History reviewed. No pertinent past medical history.  Past Surgical History  Procedure Date  . Mouth surgery     Family History  Problem Relation Age of Onset  . Cancer Other     History  Substance Use Topics  . Smoking status: Current Everyday Smoker -- 0.5 packs/day for 5 years    Types: Cigarettes  . Smokeless tobacco: Never Used  . Alcohol Use: No    OB History    Grav Para Term Preterm Abortions TAB SAB Ect Mult Living   2 2 2       2       Review of Systems  Constitutional: Negative for fever.       10 Systems reviewed and are negative for acute change except as noted in the HPI.  HENT: Positive for dental problem. Negative for congestion.   Eyes: Negative for discharge and redness.  Respiratory: Negative for cough and shortness of breath.   Cardiovascular: Negative for chest pain.  Gastrointestinal: Negative for vomiting and abdominal pain.  Musculoskeletal: Negative for back pain.  Skin: Negative for rash.  Neurological: Negative for syncope, numbness and headaches.  Psychiatric/Behavioral:       No behavior change.    Allergies  Coconut flavor and Hydrocodone  Home Medications   Current Outpatient Rx  Name  Route Sig Dispense Refill  . IMPLANON [place] Subcutaneous Inject into the skin.      . IBUPROFEN 200 MG PO TABS Oral Take 400 mg by mouth every 6 (six) hours as needed.      . OXYCODONE-ACETAMINOPHEN 5-325 MG PO TABS Oral Take 1 tablet by mouth every 4 (four) hours as needed for pain. 20 tablet 0    BP 118/75  Pulse 83  Temp 97.9 F (36.6 C)  Resp 20  Ht 5\' 5"  (1.651 m)  Wt 147 lb (66.679 kg)  BMI 24.46 kg/m2  SpO2 100%  Physical Exam  Nursing note and vitals reviewed. Constitutional: She is oriented to person, place, and time. She appears well-developed and well-nourished. No distress.       Awake, alert, nontoxic appearance.  HENT:  Head: Atraumatic.  Mouth/Throat: No oral lesions. Dental caries present. No dental abscesses.         Poor dentition. Several cracked and broken teeth  Eyes: Right eye exhibits no discharge. Left eye exhibits no discharge.  Neck: Neck supple.  Cardiovascular: Normal rate and normal heart sounds.   Pulmonary/Chest: Effort normal and breath sounds normal. She exhibits no tenderness.  Abdominal: Soft. There is no tenderness. There is no rebound.  Musculoskeletal: She exhibits no tenderness.       Baseline ROM, no obvious new focal weakness.  Neurological: She is alert and oriented to person, place, and time.  Mental status and motor strength appears baseline for patient and situation.  Skin: No rash noted.  Psychiatric: She has a normal mood and affect.    ED Course  Procedures (including critical care time)    1. Pain, dental       MDM  Patient with acute on chronic dental pain. Scheduled for teeth extraction in two weeks. Given ibuprofen and oxycodone. Pt stable in ED with no significant deterioration in condition.The patient appears reasonably screened and/or stabilized for discharge and I doubt any other medical condition or other [facility] requiring further screening, evaluation, or treatment in the ED at this time prior to  discharge.  MDM Reviewed: previous chart, nursing note and vitals           Nicoletta Dress. Colon Branch, MD 01/15/12 2134

## 2012-01-15 NOTE — Discharge Instructions (Signed)
Keep your appointment with your dentist in two weeks.    Dental Pain Toothache is pain in or around a tooth. It may get worse with chewing or with cold or heat.  HOME CARE  Your dentist may use a numbing medicine during treatment. If so, you may need to avoid eating until the medicine wears off. Ask your dentist about this.   Only take medicine as told by your dentist or doctor.   Avoid chewing food near the painful tooth until after all treatment is done. Ask your dentist about this.  GET HELP RIGHT AWAY IF:   The problem gets worse or new problems appear.   You have a fever.   There is redness and puffiness (swelling) of the face, jaw, or neck.   You cannot open your mouth.   There is pain in the jaw.   There is very bad pain that is not helped by medicine.  MAKE SURE YOU:   Understand these instructions.   Will watch your condition.   Will get help right away if you are not doing well or get worse.  Document Released: 12/20/2007 Document Revised: 06/22/2011 Document Reviewed: 12/20/2007 [facility] Patient Information 2012 [place], [place].

## 2012-02-20 ENCOUNTER — Encounter (HOSPITAL_COMMUNITY): Payer: Self-pay | Admitting: Emergency Medicine

## 2012-02-20 ENCOUNTER — Emergency Department (HOSPITAL_COMMUNITY)
Admission: EM | Admit: 2012-02-20 | Discharge: 2012-02-20 | Disposition: A | Discharge status: Home / Self Care | Payer: Self-pay | Attending: Emergency Medicine | Admitting: Emergency Medicine

## 2012-02-20 DIAGNOSIS — K0889 Other specified disorders of teeth and supporting structures: Secondary | ICD-10-CM

## 2012-02-20 DIAGNOSIS — K029 Dental caries, unspecified: Secondary | ICD-10-CM | POA: Insufficient documentation

## 2012-02-20 DIAGNOSIS — F17200 Nicotine dependence, unspecified, uncomplicated: Secondary | ICD-10-CM | POA: Insufficient documentation

## 2012-02-20 MED ORDER — OXYCODONE-ACETAMINOPHEN 5-325 MG PO TABS
1.0000 | ORAL_TABLET | Freq: Once | ORAL | Status: AC
  Administered 2012-02-20: 1 via ORAL
  Filled 2012-02-20: qty 1

## 2012-02-20 MED ORDER — PENICILLIN V POTASSIUM 250 MG PO TABS
500.0000 mg | ORAL_TABLET | Freq: Once | ORAL | Status: AC
  Administered 2012-02-20: 500 mg via ORAL
  Filled 2012-02-20: qty 2

## 2012-02-20 MED ORDER — PENICILLIN V POTASSIUM 500 MG PO TABS: 500.0000 mg | ORAL_TABLET | Freq: Four times a day (QID) | ORAL | Status: AC

## 2012-02-20 MED ORDER — OXYCODONE-ACETAMINOPHEN 5-325 MG PO TABS: 1.0000 | ORAL_TABLET | ORAL | Status: AC | PRN

## 2012-02-20 NOTE — ED Notes (Signed)
Patient states she broke off a piece of her tooth today. States she has been having dental pain and problems with tooth for a while.

## 2012-02-20 NOTE — ED Provider Notes (Signed)
History     CSN: [id number]  Arrival date & time 02/20/12  2147   First MD Initiated Contact with Patient 02/20/12 2241      Chief Complaint  Patient presents with  . Dental Pain    (Consider location/radiation/quality/duration/timing/severity/associated sxs/prior treatment) HPI Comments: Patient with hx of frequent ED visits for dental pain, c/o increasing pain to her right lower molars after a piece of tooth "broke off" earlier today.    Patient is a 26 y.o. female presenting with tooth pain. The history is provided by the patient.  Dental PainThe primary symptoms include mouth pain. Primary symptoms do not include dental injury, oral bleeding, headaches, fever, shortness of breath, sore throat, angioedema or cough. The symptoms began 6 to 12 hours ago. The symptoms are worsening. The symptoms are chronic. The symptoms occur constantly.  Affected locations include: teeth and gum(s).  Additional symptoms include: dental sensitivity to temperature and gum tenderness. Additional symptoms do not include: gum swelling, purulent gums, trismus, jaw pain, facial swelling, trouble swallowing and ear pain. Medical issues include: smoking and periodontal disease.    History reviewed. No pertinent past medical history.  Past Surgical History  Procedure Date  . Mouth surgery     Family History  Problem Relation Age of Onset  . Cancer Other     History  Substance Use Topics  . Smoking status: Current Everyday Smoker -- 0.5 packs/day for 5 years    Types: Cigarettes  . Smokeless tobacco: Never Used  . Alcohol Use: No    OB History    Grav Para Term Preterm Abortions TAB SAB Ect Mult Living   2 2 2       2       Review of Systems  Constitutional: Negative for fever and appetite change.  HENT: Positive for dental problem. Negative for ear pain, congestion, sore throat, facial swelling, trouble swallowing, neck pain and neck stiffness.   Eyes: Negative for pain and visual  disturbance.  Respiratory: Negative for cough and shortness of breath.   Neurological: Negative for dizziness, facial asymmetry and headaches.  Hematological: Negative for adenopathy.  All other systems reviewed and are negative.    Allergies  Coconut flavor and Hydrocodone  Home Medications   Current Outpatient Rx  Name Route Sig Dispense Refill  . ACETAMINOPHEN 500 MG PO TABS Oral Take 500 mg by mouth as needed.    . IBUPROFEN 200 MG PO TABS Oral Take 400 mg by mouth every 6 (six) hours as needed.      . IMPLANON [place] Subcutaneous Inject into the skin.        BP 108/74  Pulse 72  Temp 97.4 F (36.3 C) (Oral)  Resp 16  Ht 5\' 5"  (1.651 m)  Wt 147 lb (66.679 kg)  BMI 24.46 kg/m2  SpO2 99%  Physical Exam  Nursing note and vitals reviewed. Constitutional: She is oriented to person, place, and time. She appears well-developed and well-nourished. No distress.  HENT:  Head: Normocephalic and atraumatic. No trismus in the jaw.  Right Ear: Tympanic membrane and ear canal normal.  Left Ear: Tympanic membrane and ear canal normal.  Mouth/Throat: Uvula is midline, oropharynx is clear and moist and mucous membranes are normal. Dental caries present. No dental abscesses or uvula swelling.    Neck: Normal range of motion. Neck supple.  Cardiovascular: Normal rate, regular rhythm and normal heart sounds.   No murmur heard. Pulmonary/Chest: Effort normal and breath sounds normal.  Musculoskeletal: Normal range  of motion.  Lymphadenopathy:    She has no cervical adenopathy.  Neurological: She is alert and oriented to person, place, and time. She exhibits normal muscle tone. Coordination normal.  Skin: Skin is warm and dry.    ED Course  Procedures (including critical care time)  Labs Reviewed - No data to display No results found.        MDM    Previous ED charts reviewed by me.  Pt has multiple ED visits for dental pain.  I have advised her that further management of  her dental pain would need to be handled by a dentist.  She verbalized understanding.    ttp of the right lower second molar and upper left second molar.  Widespread dental decay.  No facial swelling or abscess.  No trismus.      The patient appears reasonably screened and/or stabilized for discharge and I doubt any other medical condition or other [facility] requiring further screening, evaluation, or treatment in the ED at this time prior to discharge.   Prescribed:  Pen VK Percocet #10    Samil Mecham L. [place], [place] 02/23/12 4098

## 2012-02-23 NOTE — ED Provider Notes (Signed)
Medical screening examination/treatment/procedure(s) were performed by non-physician practitioner and as supervising physician I was immediately available for consultation/collaboration.   Glynn Octave, MD 02/23/12 0030

## 2012-03-01 ENCOUNTER — Encounter (HOSPITAL_COMMUNITY): Payer: Self-pay | Admitting: Emergency Medicine

## 2012-03-01 ENCOUNTER — Emergency Department (HOSPITAL_COMMUNITY)
Admission: EM | Admit: 2012-03-01 | Discharge: 2012-03-01 | Disposition: A | Discharge status: Home / Self Care | Payer: Self-pay | Attending: Emergency Medicine | Admitting: Emergency Medicine

## 2012-03-01 DIAGNOSIS — F17200 Nicotine dependence, unspecified, uncomplicated: Secondary | ICD-10-CM | POA: Insufficient documentation

## 2012-03-01 DIAGNOSIS — K029 Dental caries, unspecified: Secondary | ICD-10-CM | POA: Insufficient documentation

## 2012-03-01 DIAGNOSIS — K0889 Other specified disorders of teeth and supporting structures: Secondary | ICD-10-CM

## 2012-03-01 MED ORDER — OXYCODONE-ACETAMINOPHEN 5-325 MG PO TABS
1.0000 | ORAL_TABLET | Freq: Once | ORAL | Status: AC
  Administered 2012-03-01: 1 via ORAL
  Filled 2012-03-01: qty 1

## 2012-03-01 MED ORDER — PENICILLIN V POTASSIUM 250 MG PO TABS
500.0000 mg | ORAL_TABLET | Freq: Once | ORAL | Status: AC
  Administered 2012-03-01: 500 mg via ORAL
  Filled 2012-03-01: qty 2

## 2012-03-01 MED ORDER — OXYCODONE-ACETAMINOPHEN 5-325 MG PO TABS: ORAL_TABLET | ORAL | Status: DC

## 2012-03-01 MED ORDER — PENICILLIN V POTASSIUM 500 MG PO TABS: 500.0000 mg | ORAL_TABLET | Freq: Four times a day (QID) | ORAL | Status: AC

## 2012-03-01 NOTE — ED Notes (Signed)
Pt c/o dental pain since yesterday.  

## 2012-03-01 NOTE — ED Provider Notes (Signed)
History     CSN: [id number]  Arrival date & time 03/01/12  4259   First MD Initiated Contact with Patient 03/01/12 1900      Chief Complaint  Patient presents with  . Dental Pain    (Consider location/radiation/quality/duration/timing/severity/associated sxs/prior treatment) HPI Comments: States she has an appt for extraction of L lower 2nd molar by dr. Manson Passey in [place] next Wednesday.  Taking tylenol and ibuprofen with no relief.  Just finished pen VK regimen.  The history is provided by the patient. No language interpreter was used.    History reviewed. No pertinent past medical history.  Past Surgical History  Procedure Date  . Mouth surgery     Family History  Problem Relation Age of Onset  . Cancer Other     History  Substance Use Topics  . Smoking status: Current Everyday Smoker -- 0.5 packs/day for 5 years    Types: Cigarettes  . Smokeless tobacco: Never Used  . Alcohol Use: No    OB History    Grav Para Term Preterm Abortions TAB SAB Ect Mult Living   2 2 2       2       Review of Systems  Constitutional: Negative for fever and chills.  HENT: Positive for dental problem.   All other systems reviewed and are negative.    Allergies  Coconut flavor and Hydrocodone  Home Medications   Current Outpatient Rx  Name Route Sig Dispense Refill  . ACETAMINOPHEN 500 MG PO TABS Oral Take 500 mg by mouth as needed.    . IBUPROFEN 200 MG PO TABS Oral Take 400 mg by mouth every 6 (six) hours as needed.      . IMPLANON [place] Subcutaneous Inject into the skin.      . OXYCODONE-ACETAMINOPHEN 5-325 MG PO TABS Oral Take 1 tablet by mouth every 4 (four) hours as needed for pain. 10 tablet 0  . OXYCODONE-ACETAMINOPHEN 5-325 MG PO TABS  One tab po q 6 hrs prn pain. 20 tablet 0  . PENICILLIN V POTASSIUM 500 MG PO TABS Oral Take 1 tablet (500 mg total) by mouth 4 (four) times daily. 40 tablet 0    BP 116/77  Pulse 82  Temp 97.8 F (36.6 C) (Oral)  Resp 20  Ht 5\' 5"   (1.651 m)  Wt 147 lb (66.679 kg)  BMI 24.46 kg/m2  SpO2 100%  Physical Exam  Nursing note and vitals reviewed. Constitutional: She is oriented to person, place, and time. She appears well-developed and well-nourished. No distress.  HENT:  Head: Normocephalic and atraumatic.  Mouth/Throat: Uvula is midline, oropharynx is clear and moist and mucous membranes are normal. Dental caries present. No dental abscesses or uvula swelling.    Eyes: EOM are normal.  Neck: Normal range of motion.  Cardiovascular: Normal rate, regular rhythm and normal heart sounds.   Pulmonary/Chest: Effort normal and breath sounds normal.  Abdominal: Soft. She exhibits no distension. There is no tenderness.  Musculoskeletal: Normal range of motion.  Neurological: She is alert and oriented to person, place, and time.  Skin: Skin is warm and dry.  Psychiatric: She has a normal mood and affect. Judgment normal.    ED Course  Procedures (including critical care time)  Labs Reviewed - No data to display No results found.   1. Pain, dental       MDM  rx-pen VK 500 mg , 40 rx-percocet, 20 Ibuprofen 800 mg F/u with dr. Manson Passey as planned.  Evalina Field, [place] 03/02/12 2236

## 2012-03-01 NOTE — ED Notes (Signed)
R. Miller, PA at bedside  

## 2012-03-03 NOTE — ED Provider Notes (Signed)
Medical screening examination/treatment/procedure(s) were performed by non-physician practitioner and as supervising physician I was immediately available for consultation/collaboration.   Aisley Whan III, MD 03/03/12 1342 

## 2012-03-18 ENCOUNTER — Encounter (HOSPITAL_COMMUNITY): Payer: Self-pay | Admitting: *Deleted

## 2012-03-18 ENCOUNTER — Emergency Department (HOSPITAL_COMMUNITY)
Admission: EM | Admit: 2012-03-18 | Discharge: 2012-03-19 | Disposition: A | Discharge status: Home / Self Care | Payer: Self-pay | Attending: Emergency Medicine | Admitting: Emergency Medicine

## 2012-03-18 DIAGNOSIS — Z885 Allergy status to narcotic agent status: Secondary | ICD-10-CM | POA: Insufficient documentation

## 2012-03-18 DIAGNOSIS — K0889 Other specified disorders of teeth and supporting structures: Secondary | ICD-10-CM

## 2012-03-18 DIAGNOSIS — F17200 Nicotine dependence, unspecified, uncomplicated: Secondary | ICD-10-CM | POA: Insufficient documentation

## 2012-03-18 DIAGNOSIS — Z809 Family history of malignant neoplasm, unspecified: Secondary | ICD-10-CM | POA: Insufficient documentation

## 2012-03-18 DIAGNOSIS — K089 Disorder of teeth and supporting structures, unspecified: Secondary | ICD-10-CM | POA: Insufficient documentation

## 2012-03-18 DIAGNOSIS — Z91018 Allergy to other foods: Secondary | ICD-10-CM | POA: Insufficient documentation

## 2012-03-18 NOTE — ED Notes (Signed)
Pt c/o severe pain to right and left upper jaw. Pt states pain comes and goes.

## 2012-03-19 MED ORDER — IBUPROFEN 800 MG PO TABS
800.0000 mg | ORAL_TABLET | Freq: Once | ORAL | Status: AC
  Administered 2012-03-19: 800 mg via ORAL
  Filled 2012-03-19: qty 1

## 2012-03-19 MED ORDER — IBUPROFEN 800 MG PO TABS: 800.0000 mg | ORAL_TABLET | Freq: Three times a day (TID) | ORAL | Status: AC

## 2012-03-19 MED ORDER — OXYCODONE-ACETAMINOPHEN 5-325 MG PO TABS: 2.0000 | ORAL_TABLET | ORAL | Status: AC | PRN

## 2012-03-19 MED ORDER — OXYCODONE-ACETAMINOPHEN 5-325 MG PO TABS
1.0000 | ORAL_TABLET | Freq: Once | ORAL | Status: AC
  Administered 2012-03-19: 1 via ORAL
  Filled 2012-03-19: qty 1

## 2012-03-19 NOTE — ED Notes (Signed)
Pt discharged. Pt stable at time of discharge. Medications reviewed pt has no questions regarding discharge at this time. Pt voiced understanding of discharge instructions.  

## 2012-03-19 NOTE — ED Provider Notes (Signed)
History     CSN: [id number]  Arrival date & time 03/18/12  2333   First MD Initiated Contact with Patient 03/19/12 0012      Chief Complaint  Patient presents with  . Dental Pain    (Consider location/radiation/quality/duration/timing/severity/associated sxs/prior treatment) HPI  Brenda Keller is a 26 y.o. female who presents to the Emergency Department complaining of dental pain to left lower second molar and right lower second molar. She was to have teeth extracted by Dr. Manson Passey two weeks ago and had financial problems. Unable to schedule until she has more money. Continued pain to both teeth. She completed a course of Pen VK mid August.  History reviewed. No pertinent past medical history.  Past Surgical History  Procedure Date  . Mouth surgery     Family History  Problem Relation Age of Onset  . Cancer Other     History  Substance Use Topics  . Smoking status: Current Everyday Smoker -- 0.5 packs/day for 5 years    Types: Cigarettes  . Smokeless tobacco: Never Used  . Alcohol Use: No    OB History    Grav Para Term Preterm Abortions TAB SAB Ect Mult Living   2 2 2       2       Review of Systems  Constitutional: Negative for fever.       10 Systems reviewed and are negative for acute change except as noted in the HPI.  HENT: Positive for dental problem. Negative for congestion.   Eyes: Negative for discharge and redness.  Respiratory: Negative for cough and shortness of breath.   Cardiovascular: Negative for chest pain.  Gastrointestinal: Negative for vomiting and abdominal pain.  Musculoskeletal: Negative for back pain.  Skin: Negative for rash.  Neurological: Negative for syncope, numbness and headaches.  Psychiatric/Behavioral:       No behavior change.    Allergies  Coconut flavor and Hydrocodone  Home Medications   Current Outpatient Rx  Name Route Sig Dispense Refill  . ACETAMINOPHEN 500 MG PO TABS Oral Take 500 mg by mouth as needed.    .  IMPLANON [place] Subcutaneous Inject into the skin.      . IBUPROFEN 200 MG PO TABS Oral Take 400 mg by mouth every 6 (six) hours as needed.      . OXYCODONE-ACETAMINOPHEN 5-325 MG PO TABS  One tab po q 6 hrs prn pain. 20 tablet 0    BP 102/77  Pulse 93  Temp 98.1 F (36.7 C) (Oral)  Resp 20  Ht 5\' 5"  (1.651 m)  Wt 149 lb (67.586 kg)  BMI 24.79 kg/m2  SpO2 99%  Physical Exam  Nursing note and vitals reviewed. Constitutional:       Awake, alert, nontoxic appearance.  HENT:  Head: Atraumatic.       Left and right lower 2nd molars with fractures and missing pieces. No abscess either side.  Eyes: Right eye exhibits no discharge. Left eye exhibits no discharge.  Neck: Neck supple.  Pulmonary/Chest: Effort normal. She exhibits no tenderness.  Abdominal: Soft. There is no tenderness. There is no rebound.  Musculoskeletal: She exhibits no tenderness.       Baseline ROM, no obvious new focal weakness.  Neurological:       Mental status and motor strength appears baseline for patient and situation.  Skin: No rash noted.  Psychiatric: She has a normal mood and affect.    ED Course  Procedures (including critical care time)  Labs Reviewed - No data to display No results found.   No diagnosis found.    MDM  Patient with dental pain, recurrent. Given analgesic and antiinflammatory. Pt stable in ED with no significant deterioration in condition.The patient appears reasonably screened and/or stabilized for discharge and I doubt any other medical condition or other [facility] requiring further screening, evaluation, or treatment in the ED at this time prior to discharge.  MDM Reviewed: nursing note, vitals and previous chart           Nicoletta Dress. Colon Branch, MD 03/19/12 [place]

## 2012-04-01 ENCOUNTER — Encounter (HOSPITAL_COMMUNITY): Payer: Self-pay | Admitting: *Deleted

## 2012-04-01 ENCOUNTER — Emergency Department (HOSPITAL_COMMUNITY)
Admission: EM | Admit: 2012-04-01 | Discharge: 2012-04-01 | Disposition: A | Discharge status: Home / Self Care | Payer: Self-pay | Attending: Emergency Medicine | Admitting: Emergency Medicine

## 2012-04-01 DIAGNOSIS — K029 Dental caries, unspecified: Secondary | ICD-10-CM | POA: Insufficient documentation

## 2012-04-01 DIAGNOSIS — J029 Acute pharyngitis, unspecified: Secondary | ICD-10-CM | POA: Insufficient documentation

## 2012-04-01 DIAGNOSIS — K0889 Other specified disorders of teeth and supporting structures: Secondary | ICD-10-CM

## 2012-04-01 DIAGNOSIS — F17200 Nicotine dependence, unspecified, uncomplicated: Secondary | ICD-10-CM | POA: Insufficient documentation

## 2012-04-01 MED ORDER — IBUPROFEN 800 MG PO TABS: 800.0000 mg | ORAL_TABLET | Freq: Three times a day (TID) | ORAL | Status: DC

## 2012-04-01 MED ORDER — PENICILLIN V POTASSIUM 250 MG PO TABS
500.0000 mg | ORAL_TABLET | Freq: Once | ORAL | Status: AC
  Administered 2012-04-01: 500 mg via ORAL
  Filled 2012-04-01: qty 2

## 2012-04-01 MED ORDER — TRAMADOL HCL 50 MG PO TABS
100.0000 mg | ORAL_TABLET | Freq: Once | ORAL | Status: AC
  Administered 2012-04-01: 100 mg via ORAL
  Filled 2012-04-01: qty 2

## 2012-04-01 MED ORDER — IBUPROFEN 800 MG PO TABS
800.0000 mg | ORAL_TABLET | Freq: Once | ORAL | Status: AC
  Administered 2012-04-01: 800 mg via ORAL
  Filled 2012-04-01: qty 1

## 2012-04-01 MED ORDER — PENICILLIN V POTASSIUM 500 MG PO TABS: ORAL_TABLET | ORAL | Status: DC

## 2012-04-01 MED ORDER — ONDANSETRON HCL 4 MG PO TABS
4.0000 mg | ORAL_TABLET | Freq: Once | ORAL | Status: AC
  Administered 2012-04-01: 4 mg via ORAL
  Filled 2012-04-01: qty 1

## 2012-04-01 MED ORDER — TRAMADOL HCL 50 MG PO TABS: ORAL_TABLET | ORAL | Status: DC

## 2012-04-01 NOTE — ED Provider Notes (Signed)
History     CSN: [id number]  Arrival date & time 04/01/12  1135   None     Chief Complaint  Patient presents with  . Dental Pain    (Consider location/radiation/quality/duration/timing/severity/associated sxs/prior treatment) Patient is a 26 y.o. female presenting with tooth pain. The history is provided by the patient.  Dental PainThe primary symptoms include mouth pain, headaches and sore throat. Primary symptoms do not include shortness of breath or cough. The symptoms began 3 to 5 days ago. The symptoms are unchanged. The symptoms occur constantly.  The headache is not associated with photophobia.  Additional symptoms include: gum swelling, jaw pain, pain with swallowing and ear pain. Additional symptoms do not include: nosebleeds. Medical issues include: smoking.    History reviewed. No pertinent past medical history.  Past Surgical History  Procedure Date  . Mouth surgery     Family History  Problem Relation Age of Onset  . Cancer Other     History  Substance Use Topics  . Smoking status: Current Every Day Smoker -- 0.5 packs/day for 5 years    Types: Cigarettes  . Smokeless tobacco: Never Used  . Alcohol Use: No    OB History    Grav Para Term Preterm Abortions TAB SAB Ect Mult Living   2 2 2       2       Review of Systems  Constitutional: Negative for activity change.       All ROS Neg except as noted in HPI  HENT: Positive for ear pain, sore throat and dental problem. Negative for nosebleeds and neck pain.   Eyes: Negative for photophobia and discharge.  Respiratory: Negative for cough, shortness of breath and wheezing.   Cardiovascular: Negative for chest pain and palpitations.  Gastrointestinal: Negative for abdominal pain and blood in stool.  Genitourinary: Negative for dysuria, frequency and hematuria.  Musculoskeletal: Negative for back pain and arthralgias.  Skin: Negative.   Neurological: Positive for headaches. Negative for dizziness,  seizures and speech difficulty.  Psychiatric/Behavioral: Negative for hallucinations and confusion.    Allergies  Coconut flavor and Hydrocodone  Home Medications   Current Outpatient Rx  Name Route Sig Dispense Refill  . ACETAMINOPHEN 500 MG PO TABS Oral Take 500 mg by mouth as needed.    . IMPLANON [place] Subcutaneous Inject into the skin.      . IBUPROFEN 200 MG PO TABS Oral Take 400 mg by mouth every 6 (six) hours as needed.      . IBUPROFEN 800 MG PO TABS Oral Take 1 tablet (800 mg total) by mouth 3 (three) times daily. 21 tablet 0  . OXYCODONE-ACETAMINOPHEN 5-325 MG PO TABS  One tab po q 6 hrs prn pain. 20 tablet 0  . PENICILLIN V POTASSIUM 500 MG PO TABS  2 po bid with food 28 tablet 0  . TRAMADOL HCL 50 MG PO TABS  1 or 2 po q4h prn pain 20 tablet 0    BP 97/60  Pulse 94  Temp 97.9 F (36.6 C) (Oral)  Resp 20  Ht 5\' 5"  (1.651 m)  Wt 147 lb (66.679 kg)  BMI 24.46 kg/m2  SpO2 100%  Physical Exam  Nursing note and vitals reviewed. Constitutional: She is oriented to person, place, and time. She appears well-developed and well-nourished.  Non-toxic appearance.  HENT:  Head: Normocephalic.  Right Ear: Tympanic membrane and external ear normal.  Left Ear: Tympanic membrane and external ear normal.  There are deep dental caries involving the right first and second lower molar. There is minimal swelling of the gum. There is no swelling under the tongue. The right tonsil is swollen. The airway is patent, the speech is understandable.  Eyes: EOM and lids are normal. Pupils are equal, round, and reactive to light.  Neck: Normal range of motion. Neck supple. Carotid bruit is not present.  Cardiovascular: Normal rate, regular rhythm, normal heart sounds, intact distal pulses and normal pulses.   Pulmonary/Chest: Breath sounds normal. No respiratory distress.  Abdominal: Soft. Bowel sounds are normal. There is no tenderness. There is no guarding.  Musculoskeletal: Normal range of  motion.  Lymphadenopathy:       Head (right side): No submandibular adenopathy present.       Head (left side): No submandibular adenopathy present.    She has no cervical adenopathy.  Neurological: She is alert and oriented to person, place, and time. She has normal strength. No cranial nerve deficit or sensory deficit.  Skin: Skin is warm and dry.  Psychiatric: She has a normal mood and affect. Her speech is normal.    ED Course  Procedures (including critical care time)  Labs Reviewed - No data to display No results found.   1. Toothache   2. Pharyngitis       MDM  I have reviewed nursing notes, vital signs, and all appropriate lab and imaging results for this patient. Patient has had emergency room visits for dental issues. Patient states she is attempting to get a dentist but has not had the money for the dental co-pay at this point. The patient also is noted to have some swelling of the right tonsil he complains of a sore throat and headache. Prescription for penicillin 500 mg 2 times daily, all tramadol or 2 tablets every 4-6 hours. And ibuprofen 3 times daily. Dental resources list provided for the patient.       Kathie Dike, [place] 04/01/12 1330

## 2012-04-01 NOTE — ED Notes (Signed)
Dental pain mandibular molar on right .  Sore throat, rt ear pain.  Says she has had a fever.

## 2012-04-02 NOTE — ED Provider Notes (Signed)
Medical screening examination/treatment/procedure(s) were performed by non-physician practitioner and as supervising physician I was immediately available for consultation/collaboration.   Cherisse Carrell W. Zamire Whitehurst, MD 04/02/12 2134 

## 2012-04-09 ENCOUNTER — Encounter (HOSPITAL_COMMUNITY): Payer: Self-pay | Admitting: *Deleted

## 2012-04-09 ENCOUNTER — Emergency Department (HOSPITAL_COMMUNITY)
Admission: EM | Admit: 2012-04-09 | Discharge: 2012-04-09 | Disposition: A | Discharge status: Home / Self Care | Payer: Self-pay | Attending: Emergency Medicine | Admitting: Emergency Medicine

## 2012-04-09 DIAGNOSIS — K0889 Other specified disorders of teeth and supporting structures: Secondary | ICD-10-CM

## 2012-04-09 DIAGNOSIS — K089 Disorder of teeth and supporting structures, unspecified: Secondary | ICD-10-CM | POA: Insufficient documentation

## 2012-04-09 DIAGNOSIS — Z91018 Allergy to other foods: Secondary | ICD-10-CM | POA: Insufficient documentation

## 2012-04-09 DIAGNOSIS — Z885 Allergy status to narcotic agent status: Secondary | ICD-10-CM | POA: Insufficient documentation

## 2012-04-09 DIAGNOSIS — F17200 Nicotine dependence, unspecified, uncomplicated: Secondary | ICD-10-CM | POA: Insufficient documentation

## 2012-04-09 DIAGNOSIS — Z809 Family history of malignant neoplasm, unspecified: Secondary | ICD-10-CM | POA: Insufficient documentation

## 2012-04-09 MED ORDER — PENICILLIN V POTASSIUM 500 MG PO TABS: 500.0000 mg | ORAL_TABLET | Freq: Four times a day (QID) | ORAL | Status: DC

## 2012-04-09 MED ORDER — OXYCODONE-ACETAMINOPHEN 5-325 MG PO TABS: ORAL_TABLET | ORAL | Status: DC

## 2012-04-09 MED ORDER — OXYCODONE-ACETAMINOPHEN 5-325 MG PO TABS
1.0000 | ORAL_TABLET | Freq: Once | ORAL | Status: AC
  Administered 2012-04-09: 1 via ORAL
  Filled 2012-04-09: qty 1

## 2012-04-09 MED ORDER — PENICILLIN V POTASSIUM 250 MG PO TABS
500.0000 mg | ORAL_TABLET | Freq: Once | ORAL | Status: AC
  Administered 2012-04-09: 500 mg via ORAL
  Filled 2012-04-09: qty 2

## 2012-04-09 NOTE — ED Notes (Addendum)
Pt waiting to get in touch with a ride home before percocet is to be given.

## 2012-04-09 NOTE — ED Provider Notes (Signed)
Medical screening examination/treatment/procedure(s) were performed by non-physician practitioner and as supervising physician I was immediately available for consultation/collaboration.   Ahmoni Edge L Christyna Letendre, MD 04/09/12 1538 

## 2012-04-09 NOTE — ED Provider Notes (Signed)
History     CSN: [id number]  Arrival date & time 04/09/12  0915   First MD Initiated Contact with Patient 04/09/12 0920      Chief Complaint  Patient presents with  . Dental Pain    (Consider location/radiation/quality/duration/timing/severity/associated sxs/prior treatment) HPI Comments: On waiting list to be seen at a free dental clinic.  No fever or chills.  Patient is a 26 y.o. female presenting with tooth pain. The history is provided by the patient. No language interpreter was used.  Dental PainThe primary symptoms include mouth pain. Primary symptoms do not include fever. Episode onset: ~ 1 week ago. The symptoms are unchanged. The symptoms occur constantly.  Additional symptoms include: jaw pain. Additional symptoms do not include: purulent gums, trismus and facial swelling.    History reviewed. No pertinent past medical history.  Past Surgical History  Procedure Date  . Mouth surgery     Family History  Problem Relation Age of Onset  . Cancer Other     History  Substance Use Topics  . Smoking status: Current Every Day Smoker -- 0.5 packs/day for 5 years    Types: Cigarettes  . Smokeless tobacco: Never Used  . Alcohol Use: No    OB History    Grav Para Term Preterm Abortions TAB SAB Ect Mult Living   2 2 2       2       Review of Systems  Constitutional: Negative for fever and chills.  HENT: Positive for dental problem. Negative for facial swelling.   All other systems reviewed and are negative.    Allergies  Coconut flavor and Hydrocodone  Home Medications   Current Outpatient Rx  Name Route Sig Dispense Refill  . ACETAMINOPHEN 500 MG PO TABS Oral Take 500 mg by mouth as needed.    . IBUPROFEN 800 MG PO TABS Oral Take 800 mg by mouth every 8 (eight) hours as needed. Pain    . PENICILLIN V POTASSIUM 500 MG PO TABS Oral Take 1,000 mg by mouth 2 (two) times daily.    . TRAMADOL HCL 50 MG PO TABS Oral Take 50 mg by mouth every 6 (six) hours as  needed. Pain    . IMPLANON [place] Subcutaneous Inject into the skin.      . OXYCODONE-ACETAMINOPHEN 5-325 MG PO TABS  One tab po q 4-6 hrs prn pain 20 tablet 0  . PENICILLIN V POTASSIUM 500 MG PO TABS Oral Take 1 tablet (500 mg total) by mouth 4 (four) times daily. 40 tablet 0    BP 111/70  Pulse 82  Temp 98 F (36.7 C) (Oral)  Resp 16  Ht 5\' 5"  (1.651 m)  Wt 147 lb (66.679 kg)  BMI 24.46 kg/m2  SpO2 100%  Physical Exam  Nursing note and vitals reviewed. Constitutional: She is oriented to person, place, and time. She appears well-developed and well-nourished. No distress.  HENT:  Head: Normocephalic and atraumatic.  Mouth/Throat: Uvula is midline, oropharynx is clear and moist and mucous membranes are normal. Dental caries present. No dental abscesses or uvula swelling.    Eyes: EOM are normal.  Neck: Normal range of motion.  Cardiovascular: Normal rate, regular rhythm and normal heart sounds.   Pulmonary/Chest: Effort normal and breath sounds normal.  Abdominal: Soft. She exhibits no distension. There is no tenderness.  Musculoskeletal: Normal range of motion.  Neurological: She is alert and oriented to person, place, and time.  Skin: Skin is warm and dry.  Psychiatric:  She has a normal mood and affect. Judgment normal.    ED Course  Procedures (including critical care time)  Labs Reviewed - No data to display No results found.   1. Pain, dental       MDM     No obvious abscess  rx-pen VK 500 mg, 40 rx-percocet, 20 OTC ibuprofen F/u with dentist of your choice        Evalina Field, [place] 04/09/12 1001

## 2012-04-09 NOTE — ED Notes (Signed)
Pt says was able to get a ride so percocet was given.

## 2012-04-09 NOTE — ED Notes (Signed)
Dental pain, bottom right and top left, x 1 week with swelling and fever.

## 2012-04-17 ENCOUNTER — Emergency Department (HOSPITAL_COMMUNITY)
Admission: EM | Admit: 2012-04-17 | Discharge: 2012-04-17 | Disposition: A | Discharge status: Home / Self Care | Payer: Self-pay | Attending: Emergency Medicine | Admitting: Emergency Medicine

## 2012-04-17 ENCOUNTER — Encounter (HOSPITAL_COMMUNITY): Payer: Self-pay | Admitting: *Deleted

## 2012-04-17 DIAGNOSIS — K029 Dental caries, unspecified: Secondary | ICD-10-CM | POA: Insufficient documentation

## 2012-04-17 DIAGNOSIS — F17200 Nicotine dependence, unspecified, uncomplicated: Secondary | ICD-10-CM | POA: Insufficient documentation

## 2012-04-17 DIAGNOSIS — K0889 Other specified disorders of teeth and supporting structures: Secondary | ICD-10-CM

## 2012-04-17 MED ORDER — OXYCODONE-ACETAMINOPHEN 5-325 MG PO TABS: ORAL_TABLET | ORAL | Status: DC

## 2012-04-17 MED ORDER — IBUPROFEN 800 MG PO TABS
800.0000 mg | ORAL_TABLET | Freq: Once | ORAL | Status: AC
  Administered 2012-04-17: 800 mg via ORAL
  Filled 2012-04-17: qty 1

## 2012-04-17 MED ORDER — PENICILLIN V POTASSIUM 250 MG PO TABS
500.0000 mg | ORAL_TABLET | Freq: Once | ORAL | Status: AC
  Administered 2012-04-17: 500 mg via ORAL
  Filled 2012-04-17: qty 2

## 2012-04-17 MED ORDER — PENICILLIN V POTASSIUM 500 MG PO TABS: 500.0000 mg | ORAL_TABLET | Freq: Four times a day (QID) | ORAL | Status: AC

## 2012-04-17 NOTE — ED Notes (Signed)
Intermittent dental pain x 6 mo.

## 2012-04-17 NOTE — ED Provider Notes (Signed)
History     CSN: [id number]  Arrival date & time 04/17/12  1753   First MD Initiated Contact with Patient 04/17/12 1825      Chief Complaint  Patient presents with  . Dental Pain    (Consider location/radiation/quality/duration/timing/severity/associated sxs/prior treatment) HPI Comments: Has appt for extractions by dr. Manson Passey, oral surgeon in ~ 2 weeks.  No fever or chills.  Patient is a 26 y.o. female presenting with tooth pain. The history is provided by the patient. No language interpreter was used.  Dental PainThe primary symptoms include mouth pain. Primary symptoms do not include dental injury or fever. Episode onset: pain intermittently x 6 months. The symptoms are worsening.  Additional symptoms include: jaw pain. Additional symptoms do not include: gum swelling, purulent gums, trismus, facial swelling, drooling and ear pain.    History reviewed. No pertinent past medical history.  Past Surgical History  Procedure Date  . Mouth surgery     Family History  Problem Relation Age of Onset  . Cancer Other     History  Substance Use Topics  . Smoking status: Current Every Day Smoker -- 0.5 packs/day for 5 years    Types: Cigarettes  . Smokeless tobacco: Never Used  . Alcohol Use: No    OB History    Grav Para Term Preterm Abortions TAB SAB Ect Mult Living   2 2 2       2       Review of Systems  Constitutional: Negative for fever and chills.  HENT: Positive for dental problem. Negative for ear pain, facial swelling and drooling.   Cardiovascular: Negative for chest pain.  All other systems reviewed and are negative.    Allergies  Coconut flavor and Hydrocodone  Home Medications   Current Outpatient Rx  Name Route Sig Dispense Refill  . ACETAMINOPHEN 500 MG PO TABS Oral Take 500 mg by mouth daily as needed. For pain    . IBUPROFEN 800 MG PO TABS Oral Take 800 mg by mouth every 8 (eight) hours as needed. Pain    . IMPLANON  Subcutaneous Inject into  the skin.      . OXYCODONE-ACETAMINOPHEN 5-325 MG PO TABS  oone tab po q 6 hrs prn pain 20 tablet 0  . PENICILLIN V POTASSIUM 500 MG PO TABS Oral Take 1 tablet (500 mg total) by mouth 4 (four) times daily. 40 tablet 0    BP 119/77  Pulse 97  Temp 98 F (36.7 C) (Oral)  Resp 20  Ht 5\' 5"  (1.651 m)  Wt 147 lb (66.679 kg)  BMI 24.46 kg/m2  SpO2 99%  Physical Exam  Nursing note and vitals reviewed. Constitutional: She is oriented to person, place, and time. She appears well-developed and well-nourished. No distress.  HENT:  Head: Normocephalic and atraumatic.  Mouth/Throat: Uvula is midline, oropharynx is clear and moist and mucous membranes are normal. Dental caries present. No dental abscesses or uvula swelling.    Eyes: EOM are normal.  Neck: Normal range of motion.  Cardiovascular: Normal rate, regular rhythm and normal heart sounds.   Pulmonary/Chest: Effort normal and breath sounds normal.  Abdominal: Soft. She exhibits no distension. There is no tenderness.  Musculoskeletal: Normal range of motion.  Neurological: She is alert and oriented to person, place, and time.  Skin: Skin is warm and dry.  Psychiatric: She has a normal mood and affect. Judgment normal.    ED Course  Procedures (including critical care time)  Labs Reviewed -  No data to display No results found.   1. Pain, dental       MDM  No obvious abscess rx-pen VK, 40 rx-percocet, 20 Ibuprofen 800 mg F/u with oral surgeon as planned.        Evalina Field, [place] 04/17/12 1857

## 2012-04-18 NOTE — ED Provider Notes (Signed)
Medical screening examination/treatment/procedure(s) were performed by non-physician practitioner and as supervising physician I was immediately available for consultation/collaboration.   Shelda Jakes, MD 04/18/12 [phone]

## 2012-04-24 ENCOUNTER — Emergency Department (HOSPITAL_COMMUNITY)
Admission: EM | Admit: 2012-04-24 | Discharge: 2012-04-24 | Disposition: A | Discharge status: Home / Self Care | Payer: Self-pay | Attending: Emergency Medicine | Admitting: Emergency Medicine

## 2012-04-24 ENCOUNTER — Encounter (HOSPITAL_COMMUNITY): Payer: Self-pay

## 2012-04-24 DIAGNOSIS — Z91018 Allergy to other foods: Secondary | ICD-10-CM | POA: Insufficient documentation

## 2012-04-24 DIAGNOSIS — Z885 Allergy status to narcotic agent status: Secondary | ICD-10-CM | POA: Insufficient documentation

## 2012-04-24 DIAGNOSIS — Z809 Family history of malignant neoplasm, unspecified: Secondary | ICD-10-CM | POA: Insufficient documentation

## 2012-04-24 DIAGNOSIS — K0889 Other specified disorders of teeth and supporting structures: Secondary | ICD-10-CM

## 2012-04-24 DIAGNOSIS — F17200 Nicotine dependence, unspecified, uncomplicated: Secondary | ICD-10-CM | POA: Insufficient documentation

## 2012-04-24 DIAGNOSIS — K029 Dental caries, unspecified: Secondary | ICD-10-CM | POA: Insufficient documentation

## 2012-04-24 MED ORDER — OXYCODONE-ACETAMINOPHEN 5-325 MG PO TABS: ORAL_TABLET | ORAL | Status: DC

## 2012-04-24 MED ORDER — PENICILLIN V POTASSIUM 250 MG PO TABS: 250.0000 mg | ORAL_TABLET | Freq: Four times a day (QID) | ORAL | Status: DC

## 2012-04-24 MED ORDER — NAPROXEN 250 MG PO TABS: 250.0000 mg | ORAL_TABLET | Freq: Two times a day (BID) | ORAL | Status: DC

## 2012-04-24 NOTE — ED Notes (Signed)
Pt reports pain to one of her upper left and lower rt teeth.  Pt reports pain off and on for a few months.

## 2012-04-24 NOTE — ED Provider Notes (Signed)
History     CSN: [id number]  Arrival date & time 04/24/12  4098   First MD Initiated Contact with Patient 04/24/12 1835      Chief Complaint  Patient presents with  . Dental Pain     HPI Pt was seen at 1835.  Per pt, c/o gradual onset and persistence of constant left upper and right lower teeth "pain" for the past 6 months, worse over the past several days.  States the dentist told her she needed "them both pulled" but she did not have the procedure due to financial issues. Denies fevers, no intra-oral edema, no rash, no facial swelling, no dysphagia, no neck pain.   The condition is aggravated by nothing. The condition is relieved by nothing. The symptoms have been associated with no other complaints. The patient has no significant history of serious medical conditions.     History reviewed. No pertinent past medical history.  Past Surgical History  Procedure Date  . Mouth surgery     Family History  Problem Relation Age of Onset  . Cancer Other     History  Substance Use Topics  . Smoking status: Current Every Day Smoker -- 0.5 packs/day for 5 years    Types: Cigarettes  . Smokeless tobacco: Never Used  . Alcohol Use: No    OB History    Grav Para Term Preterm Abortions TAB SAB Ect Mult Living   2 2 2       2       Review of Systems ROS: Statement: All systems negative except as marked or noted in the HPI; Constitutional: Negative for fever and chills. ; ; Eyes: Negative for eye pain and discharge. ; ; ENMT: Positive for dental caries, dental hygiene poor and toothache. Negative for ear pain, bleeding gums, dental injury, facial deformity, facial swelling, hoarseness, nasal congestion, sinus pressure, sore throat, throat swelling and tongue swollen. ; ; Cardiovascular: Negative for chest pain, palpitations, diaphoresis, dyspnea and peripheral edema. ; ; Respiratory: Negative for cough, wheezing and stridor. ; ; Gastrointestinal: Negative for nausea, vomiting, diarrhea  and abdominal pain. ; ; Genitourinary: Negative for dysuria, flank pain and hematuria. ; ; Musculoskeletal: Negative for back pain and neck pain. ; ; Skin: Negative for rash and skin lesion. ; ; Neuro: Negative for headache, lightheadedness and neck stiffness. ;    Allergies  Coconut flavor and Hydrocodone  Home Medications   Current Outpatient Rx  Name Route Sig Dispense Refill  . ACETAMINOPHEN 500 MG PO TABS Oral Take 500 mg by mouth daily as needed. For pain    . IMPLANON [place] Subcutaneous Inject into the skin.      . IBUPROFEN 800 MG PO TABS Oral Take 800 mg by mouth every 8 (eight) hours as needed. Pain    . NAPROXEN 250 MG PO TABS Oral Take 1 tablet (250 mg total) by mouth 2 (two) times daily with a meal. 14 tablet 0  . OXYCODONE-ACETAMINOPHEN 5-325 MG PO TABS  oone tab po q 6 hrs prn pain 20 tablet 0  . OXYCODONE-ACETAMINOPHEN 5-325 MG PO TABS  1 or 2 tabs PO q6h prn pain 20 tablet 0  . PENICILLIN V POTASSIUM 250 MG PO TABS Oral Take 1 tablet (250 mg total) by mouth 4 (four) times daily. 20 tablet 0  . PENICILLIN V POTASSIUM 500 MG PO TABS Oral Take 1 tablet (500 mg total) by mouth 4 (four) times daily. 40 tablet 0    BP 129/69  Pulse  80  Temp 97.4 F (36.3 C) (Oral)  Resp 18  SpO2 100%  Physical Exam 1840: Physical examination: Vital signs and O2 SAT: Reviewed; Constitutional: Well developed, Well nourished, Well hydrated, In no acute distress; Head and Face: Normocephalic, Atraumatic; Eyes: EOMI, PERRL, No scleral icterus; ENMT: Mouth and pharynx normal, Poor dentition, Widespread dental decay, Left TM normal, Right TM normal, Mucous membranes moist, +upper left 2nd molar and right lower 2nd molar with extensive dental decay.  No gingival erythema, edema, fluctuance, or drainage.  No hoarse voice, no drooling, no stridor.  ; Neck: Supple, Full range of motion, No lymphadenopathy; Cardiovascular: Regular rate and rhythm, No murmur, rub, or gallop; Respiratory: Breath sounds clear &  equal bilaterally, No rales, rhonchi, wheezes, or rub, Normal respiratory effort/excursion; Chest: Nontender, Movement normal; Extremities: Pulses normal, No tenderness, No edema; Neuro: AA&Ox3, Major CN grossly intact.  No gross focal motor or sensory deficits in extremities.; Skin: Color normal, No rash, No petechiae, Warm, Dry   ED Course  Procedures     MDM  MDM Reviewed: nursing note, vitals and previous chart     1840:  Pt encouraged to f/u with dentist or oral surgeon for her dental needs for good continuity of care and definitive treatment.  Verb understanding.         Laray Anger, DO 04/27/12 [phone]

## 2012-04-29 ENCOUNTER — Encounter (HOSPITAL_COMMUNITY): Payer: Self-pay | Admitting: *Deleted

## 2012-04-29 ENCOUNTER — Emergency Department (HOSPITAL_COMMUNITY)
Admission: EM | Admit: 2012-04-29 | Discharge: 2012-04-29 | Disposition: A | Discharge status: Home / Self Care | Payer: Self-pay | Attending: Emergency Medicine | Admitting: Emergency Medicine

## 2012-04-29 DIAGNOSIS — K029 Dental caries, unspecified: Secondary | ICD-10-CM | POA: Insufficient documentation

## 2012-04-29 DIAGNOSIS — Z885 Allergy status to narcotic agent status: Secondary | ICD-10-CM | POA: Insufficient documentation

## 2012-04-29 DIAGNOSIS — Z91018 Allergy to other foods: Secondary | ICD-10-CM | POA: Insufficient documentation

## 2012-04-29 DIAGNOSIS — F17200 Nicotine dependence, unspecified, uncomplicated: Secondary | ICD-10-CM | POA: Insufficient documentation

## 2012-04-29 DIAGNOSIS — K0889 Other specified disorders of teeth and supporting structures: Secondary | ICD-10-CM

## 2012-04-29 MED ORDER — OXYCODONE-ACETAMINOPHEN 5-325 MG PO TABS: 1.0000 | ORAL_TABLET | ORAL | Status: AC | PRN

## 2012-04-29 NOTE — ED Provider Notes (Signed)
History     CSN: [id number]  Arrival date & time 04/29/12  1447   First MD Initiated Contact with Patient 04/29/12 1513      Chief Complaint  Patient presents with  . Dental Pain    (Consider location/radiation/quality/duration/timing/severity/associated sxs/prior treatment) Patient is a 26 y.o. female presenting with tooth pain. The history is provided by the patient.  Dental PainThe primary symptoms include mouth pain. Primary symptoms do not include oral bleeding, headaches, fever, shortness of breath, sore throat, angioedema or cough. The symptoms began more than 1 month ago. The symptoms are unchanged. The symptoms are chronic. The symptoms occur constantly.  Mouth pain occurs constantly. Affected locations include: gum(s) and teeth.  Additional symptoms include: dental sensitivity to temperature and gum tenderness. Additional symptoms do not include: gum swelling, purulent gums, trismus, jaw pain, facial swelling, trouble swallowing, drooling, ear pain and swollen glands. Medical issues include: smoking and periodontal disease.    History reviewed. No pertinent past medical history.  Past Surgical History  Procedure Date  . Mouth surgery     Family History  Problem Relation Age of Onset  . Cancer Other     History  Substance Use Topics  . Smoking status: Current Every Day Smoker -- 0.5 packs/day for 5 years    Types: Cigarettes  . Smokeless tobacco: Never Used  . Alcohol Use: No    OB History    Grav Para Term Preterm Abortions TAB SAB Ect Mult Living   2 2 2       2       Review of Systems  Constitutional: Negative for fever and appetite change.  HENT: Positive for dental problem. Negative for ear pain, congestion, sore throat, facial swelling, drooling, trouble swallowing, neck pain and neck stiffness.   Eyes: Negative for pain and visual disturbance.  Respiratory: Negative for cough and shortness of breath.   Neurological: Negative for dizziness, facial  asymmetry and headaches.  Hematological: Negative for adenopathy.  All other systems reviewed and are negative.    Allergies  Coconut flavor and Hydrocodone  Home Medications   Current Outpatient Rx  Name Route Sig Dispense Refill  . ACETAMINOPHEN 500 MG PO TABS Oral Take 500 mg by mouth daily as needed. For pain    . IMPLANON [place] Subcutaneous Inject into the skin.      . IBUPROFEN 800 MG PO TABS Oral Take 800 mg by mouth every 8 (eight) hours as needed. Pain    . NAPROXEN 250 MG PO TABS Oral Take 1 tablet (250 mg total) by mouth 2 (two) times daily with a meal. 14 tablet 0  . OXYCODONE-ACETAMINOPHEN 5-325 MG PO TABS  oone tab po q 6 hrs prn pain 20 tablet 0  . OXYCODONE-ACETAMINOPHEN 5-325 MG PO TABS  1 or 2 tabs PO q6h prn pain 20 tablet 0  . PENICILLIN V POTASSIUM 250 MG PO TABS Oral Take 1 tablet (250 mg total) by mouth 4 (four) times daily. 20 tablet 0    BP 118/98  Pulse 80  Temp 98.4 F (36.9 C) (Oral)  Resp 20  Ht 5\' 5"  (1.651 m)  Wt 147 lb (66.679 kg)  BMI 24.46 kg/m2  SpO2 100%  Physical Exam  Nursing note and vitals reviewed. Constitutional: She is oriented to person, place, and time. She appears well-developed and well-nourished. No distress.  HENT:  Head: Normocephalic and atraumatic. No trismus in the jaw.  Right Ear: Tympanic membrane and ear canal normal.  Left Ear:  Tympanic membrane and ear canal normal.  Mouth/Throat: Uvula is midline, oropharynx is clear and moist and mucous membranes are normal. Dental caries present. No dental abscesses or uvula swelling.         Tenderness to palpation of the right and left lower molars and surrounding gums. Multiple dental caries is present. No abscess.  Neck: Normal range of motion. Neck supple.  Cardiovascular: Normal rate, regular rhythm and normal heart sounds.   No murmur heard. Pulmonary/Chest: Effort normal and breath sounds normal.  Musculoskeletal: Normal range of motion.  Lymphadenopathy:    She has no  cervical adenopathy.  Neurological: She is alert and oriented to person, place, and time. She exhibits normal muscle tone. Coordination normal.  Skin: Skin is warm and dry.    ED Course  Procedures (including critical care time)  Labs Reviewed - No data to display No results found.      MDM      Multiple ED visits for dental pain.  Pt states she has contacted Dr. Theora Gianotti office and will see her in 2 weeks.  Currently taking PCN.  I discussed with the patient that she will need further dental followup with her oral surgeon and that the ER is not the proper setting to manage chronic dental pain. She agrees to the care plan and verbalized understanding  Prescribed: Percocet #15  Zaylah Blecha L. [place], [place] 04/29/12 1808

## 2012-04-29 NOTE — ED Notes (Signed)
Dental pain off and on for 6 months,

## 2012-04-30 NOTE — ED Provider Notes (Signed)
Medical screening examination/treatment/procedure(s) were performed by non-physician practitioner and as supervising physician I was immediately available for consultation/collaboration.   Jett Fukuda L Alannis Hsia, MD 04/30/12 1625 

## 2012-07-01 ENCOUNTER — Emergency Department (HOSPITAL_COMMUNITY)
Admission: EM | Admit: 2012-07-01 | Discharge: 2012-07-01 | Disposition: A | Discharge status: Home / Self Care | Payer: Medicaid Other | Attending: Emergency Medicine | Admitting: Emergency Medicine

## 2012-07-01 ENCOUNTER — Encounter (HOSPITAL_COMMUNITY): Payer: Self-pay | Admitting: *Deleted

## 2012-07-01 DIAGNOSIS — R220 Localized swelling, mass and lump, head: Secondary | ICD-10-CM | POA: Insufficient documentation

## 2012-07-01 DIAGNOSIS — K0889 Other specified disorders of teeth and supporting structures: Secondary | ICD-10-CM

## 2012-07-01 DIAGNOSIS — F17200 Nicotine dependence, unspecified, uncomplicated: Secondary | ICD-10-CM | POA: Insufficient documentation

## 2012-07-01 DIAGNOSIS — R6884 Jaw pain: Secondary | ICD-10-CM | POA: Insufficient documentation

## 2012-07-01 DIAGNOSIS — K029 Dental caries, unspecified: Secondary | ICD-10-CM | POA: Insufficient documentation

## 2012-07-01 DIAGNOSIS — Z9889 Other specified postprocedural states: Secondary | ICD-10-CM | POA: Insufficient documentation

## 2012-07-01 DIAGNOSIS — Z79899 Other long term (current) drug therapy: Secondary | ICD-10-CM | POA: Insufficient documentation

## 2012-07-01 DIAGNOSIS — R221 Localized swelling, mass and lump, neck: Secondary | ICD-10-CM | POA: Insufficient documentation

## 2012-07-01 DIAGNOSIS — K089 Disorder of teeth and supporting structures, unspecified: Secondary | ICD-10-CM | POA: Insufficient documentation

## 2012-07-01 MED ORDER — OXYCODONE-ACETAMINOPHEN 5-325 MG PO TABS: ORAL_TABLET | ORAL | Status: DC

## 2012-07-01 NOTE — ED Notes (Signed)
Dental pain x 4 days. States she has been taking clindamycin from previous visit which she never began. Dental trouble x 1 year. Noticed mild swelling to lower jaw 2 days ago.

## 2012-07-01 NOTE — ED Provider Notes (Signed)
History     CSN: [id number]  Arrival date & time 07/01/12  1312   None     Chief Complaint  Patient presents with  . Dental Pain  . Oral Swelling    (Consider location/radiation/quality/duration/timing/severity/associated sxs/prior treatment) HPI Comments: States she had a rx for clindamycin and has been taking 150 mg QID for past 5 days.  Taking tylenol for pain with no relief.  Patient is a 26 y.o. female presenting with tooth pain. The history is provided by the patient. No language interpreter was used.  Dental PainThe primary symptoms include mouth pain. Primary symptoms do not include dental injury. Episode onset: 5 days ago. The symptoms are unchanged. The symptoms occur constantly.  Additional symptoms include: jaw pain and facial swelling. Additional symptoms do not include: purulent gums and trismus.    History reviewed. No pertinent past medical history.  Past Surgical History  Procedure Date  . Mouth surgery     Family History  Problem Relation Age of Onset  . Cancer Other     History  Substance Use Topics  . Smoking status: Current Every Day Smoker -- 0.5 packs/day for 5 years    Types: Cigarettes  . Smokeless tobacco: Never Used  . Alcohol Use: No    OB History    Grav Para Term Preterm Abortions TAB SAB Ect Mult Living   2 2 2       2       Review of Systems  HENT: Positive for facial swelling and dental problem.   All other systems reviewed and are negative.    Allergies  Coconut flavor and Hydrocodone  Home Medications   Current Outpatient Rx  Name  Route  Sig  Dispense  Refill  . ACETAMINOPHEN 500 MG PO TABS   Oral   Take 1,000 mg by mouth every 6 (six) hours as needed. For pain         . CLINDAMYCIN HCL 150 MG PO CAPS   Oral   Take 150 mg by mouth 4 (four) times daily.         Marland Kitchen NAPROXEN SODIUM 220 MG PO TABS   Oral   Take 220 mg by mouth 2 (two) times daily as needed. Pain         . IMPLANON [place]   Subcutaneous  Inject into the skin.           . OXYCODONE-ACETAMINOPHEN 5-325 MG PO TABS      One tab po q 6 hrs prn pain   20 tablet   0     BP 117/69  Pulse 89  Temp 98.4 F (36.9 C) (Oral)  Resp 20  Ht 5\' 5"  (1.651 m)  Wt 145 lb (65.772 kg)  BMI 24.13 kg/m2  SpO2 100%  Physical Exam  Nursing note and vitals reviewed. Constitutional: She is oriented to person, place, and time. She appears well-developed and well-nourished. No distress.  HENT:  Head: Normocephalic and atraumatic.  Mouth/Throat: Uvula is midline, oropharynx is clear and moist and mucous membranes are normal. Dental caries present. No uvula swelling.    Eyes: EOM are normal.  Neck: Normal range of motion.  Cardiovascular: Normal rate, regular rhythm and normal heart sounds.   Pulmonary/Chest: Effort normal and breath sounds normal.  Abdominal: Soft. She exhibits no distension. There is no tenderness.  Musculoskeletal: Normal range of motion.  Neurological: She is alert and oriented to person, place, and time.  Skin: Skin is warm and dry.  Psychiatric: She has a normal mood and affect. Judgment normal.    ED Course  Procedures (including critical care time)  Labs Reviewed - No data to display No results found.   1. Pain, dental       MDM  Has clindamycin rx-percocet, 20 Ibuprofen F/u with dentist.        Evalina Field, PA 07/01/12 1524

## 2012-07-01 NOTE — ED Notes (Addendum)
Pain rt lower jaw, says  She has had dental pain for 1 week.

## 2012-07-02 NOTE — ED Provider Notes (Signed)
Medical screening examination/treatment/procedure(s) were performed by non-physician practitioner and as supervising physician I was immediately available for consultation/collaboration.   Francis Doenges M Delmas Faucett, DO 07/02/12 1909 

## 2012-07-08 ENCOUNTER — Emergency Department (HOSPITAL_COMMUNITY)
Admission: EM | Admit: 2012-07-08 | Discharge: 2012-07-08 | Disposition: A | Discharge status: Home / Self Care | Payer: Medicaid Other | Attending: Emergency Medicine | Admitting: Emergency Medicine

## 2012-07-08 ENCOUNTER — Encounter (HOSPITAL_COMMUNITY): Payer: Self-pay | Admitting: *Deleted

## 2012-07-08 DIAGNOSIS — R6884 Jaw pain: Secondary | ICD-10-CM | POA: Insufficient documentation

## 2012-07-08 DIAGNOSIS — R220 Localized swelling, mass and lump, head: Secondary | ICD-10-CM | POA: Insufficient documentation

## 2012-07-08 DIAGNOSIS — K0889 Other specified disorders of teeth and supporting structures: Secondary | ICD-10-CM

## 2012-07-08 DIAGNOSIS — K029 Dental caries, unspecified: Secondary | ICD-10-CM | POA: Insufficient documentation

## 2012-07-08 DIAGNOSIS — Z79899 Other long term (current) drug therapy: Secondary | ICD-10-CM | POA: Insufficient documentation

## 2012-07-08 DIAGNOSIS — Z9889 Other specified postprocedural states: Secondary | ICD-10-CM | POA: Insufficient documentation

## 2012-07-08 DIAGNOSIS — K089 Disorder of teeth and supporting structures, unspecified: Secondary | ICD-10-CM | POA: Insufficient documentation

## 2012-07-08 DIAGNOSIS — F17200 Nicotine dependence, unspecified, uncomplicated: Secondary | ICD-10-CM | POA: Insufficient documentation

## 2012-07-08 MED ORDER — MELOXICAM 7.5 MG PO TABS: ORAL_TABLET | ORAL | Status: DC

## 2012-07-08 MED ORDER — OXYCODONE-ACETAMINOPHEN 5-325 MG PO TABS: 1.0000 | ORAL_TABLET | Freq: Four times a day (QID) | ORAL | Status: AC | PRN

## 2012-07-08 NOTE — ED Provider Notes (Signed)
History     CSN: [id number]  Arrival date & time 07/08/12  1200   First MD Initiated Contact with Patient 07/08/12 1311      Chief Complaint  Patient presents with  . Dental Pain    (Consider location/radiation/quality/duration/timing/severity/associated sxs/prior treatment) Patient is a 26 y.o. female presenting with tooth pain. The history is provided by the patient.  Dental PainThe primary symptoms include mouth pain. Primary symptoms do not include shortness of breath or cough. The symptoms began 5 to 7 days ago. The symptoms are unchanged. The symptoms occur frequently.  Additional symptoms include: gum swelling and jaw pain. Additional symptoms do not include: nosebleeds. Medical issues include: smoking.    History reviewed. No pertinent past medical history.  Past Surgical History  Procedure Date  . Mouth surgery     Family History  Problem Relation Age of Onset  . Cancer Other     History  Substance Use Topics  . Smoking status: Current Every Day Smoker -- 0.5 packs/day for 5 years    Types: Cigarettes  . Smokeless tobacco: Never Used  . Alcohol Use: No    OB History    Grav Para Term Preterm Abortions TAB SAB Ect Mult Living   2 2 2       2       Review of Systems  Constitutional: Negative for activity change.       All ROS Neg except as noted in HPI  HENT: Positive for dental problem. Negative for nosebleeds and neck pain.   Eyes: Negative for photophobia and discharge.  Respiratory: Negative for cough, shortness of breath and wheezing.   Cardiovascular: Negative for chest pain and palpitations.  Gastrointestinal: Negative for abdominal pain and blood in stool.  Genitourinary: Negative for dysuria, frequency and hematuria.  Musculoskeletal: Negative for back pain and arthralgias.  Skin: Negative.   Neurological: Negative for dizziness, seizures and speech difficulty.  Psychiatric/Behavioral: Negative for hallucinations and confusion.    Allergies   Coconut flavor and Hydrocodone  Home Medications   Current Outpatient Rx  Name  Route  Sig  Dispense  Refill  . ACETAMINOPHEN 500 MG PO TABS   Oral   Take 1,000 mg by mouth every 6 (six) hours as needed. For pain         . CLINDAMYCIN HCL 150 MG PO CAPS   Oral   Take 150 mg by mouth 4 (four) times daily.         Marland Kitchen NAPROXEN SODIUM 220 MG PO TABS   Oral   Take 220 mg by mouth 2 (two) times daily as needed. Pain         . IMPLANON [place]   Subcutaneous   Inject into the skin.           Marland Kitchen MELOXICAM 7.5 MG PO TABS      1 po bid with food   12 tablet   0   . OXYCODONE-ACETAMINOPHEN 5-325 MG PO TABS   Oral   Take 1 tablet by mouth every 6 (six) hours as needed for pain.   20 tablet   0     BP 119/65  Pulse 97  Temp 97.4 F (36.3 C) (Oral)  Resp 20  Ht 5\' 5"  (1.651 m)  Wt 147 lb (66.679 kg)  BMI 24.46 kg/m2  SpO2 98%  Physical Exam  Nursing note and vitals reviewed. Constitutional: She is oriented to person, place, and time. She appears well-developed and well-nourished.  Non-toxic appearance.  HENT:  Head: Normocephalic.  Right Ear: Tympanic membrane and external ear normal.  Left Ear: Tympanic membrane and external ear normal.       Pt has multiple dental caries. Has pain of the left lower molar area. Mild to mod swelling of the gum.  Eyes: EOM and lids are normal. Pupils are equal, round, and reactive to light.  Neck: Normal range of motion. Neck supple. Carotid bruit is not present.  Cardiovascular: Normal rate, regular rhythm, normal heart sounds, intact distal pulses and normal pulses.   Pulmonary/Chest: Breath sounds normal. No respiratory distress.  Abdominal: Soft. Bowel sounds are normal. There is no tenderness. There is no guarding.  Musculoskeletal: Normal range of motion.  Lymphadenopathy:       Head (right side): No submandibular adenopathy present.       Head (left side): No submandibular adenopathy present.    She has no cervical  adenopathy.  Neurological: She is alert and oriented to person, place, and time. She has normal strength. No cranial nerve deficit or sensory deficit.  Skin: Skin is warm and dry.  Psychiatric: She has a normal mood and affect. Her speech is normal.    ED Course  Procedures (including critical care time)  Labs Reviewed - No data to display No results found.   1. Toothache       MDM  I have reviewed nursing notes, vital signs, and all appropriate lab and imaging results for this patient. Pt has dental caries and dental pain. She cannot see a dentist yet. Rx for percocet and mobic given. Pt has cleocin at home.       Kathie Dike, [place] 07/09/12 [phone]

## 2012-07-08 NOTE — ED Notes (Signed)
Dental pain for 2 weeks, says her wisdom teeth are coming in.  And dental caries.

## 2012-07-08 NOTE — ED Notes (Signed)
Pt c/o dental pain bilaterally x2 weeks. Pt states "I can feel my wisdom tooth coming in where the other tooth hurts".

## 2012-07-09 NOTE — ED Provider Notes (Signed)
Medical screening examination/treatment/procedure(s) were performed by non-physician practitioner and as supervising physician I was immediately available for consultation/collaboration.    Maryrose Colvin R Shyleigh Daughtry, MD 07/09/12 1632 

## 2012-07-16 ENCOUNTER — Emergency Department (HOSPITAL_COMMUNITY)
Admission: EM | Admit: 2012-07-16 | Discharge: 2012-07-16 | Disposition: A | Discharge status: Home / Self Care | Payer: Medicaid Other | Attending: Emergency Medicine | Admitting: Emergency Medicine

## 2012-07-16 ENCOUNTER — Encounter (HOSPITAL_COMMUNITY): Payer: Self-pay | Admitting: *Deleted

## 2012-07-16 DIAGNOSIS — K029 Dental caries, unspecified: Secondary | ICD-10-CM | POA: Insufficient documentation

## 2012-07-16 DIAGNOSIS — F17200 Nicotine dependence, unspecified, uncomplicated: Secondary | ICD-10-CM | POA: Insufficient documentation

## 2012-07-16 DIAGNOSIS — Z9889 Other specified postprocedural states: Secondary | ICD-10-CM | POA: Insufficient documentation

## 2012-07-16 DIAGNOSIS — Z79899 Other long term (current) drug therapy: Secondary | ICD-10-CM | POA: Insufficient documentation

## 2012-07-16 DIAGNOSIS — R220 Localized swelling, mass and lump, head: Secondary | ICD-10-CM | POA: Insufficient documentation

## 2012-07-16 MED ORDER — PENICILLIN V POTASSIUM 500 MG PO TABS: 500.0000 mg | ORAL_TABLET | Freq: Four times a day (QID) | ORAL | Status: AC

## 2012-07-16 MED ORDER — OXYCODONE-ACETAMINOPHEN 5-325 MG PO TABS: 2.0000 | ORAL_TABLET | ORAL | Status: DC | PRN

## 2012-07-16 NOTE — ED Provider Notes (Signed)
History     CSN: [id number]  Arrival date & time 07/16/12  1336   First MD Initiated Contact with Patient 07/16/12 1348      Chief Complaint  Patient presents with  . Dental Pain    (Consider location/radiation/quality/duration/timing/severity/associated sxs/prior treatment) HPI Comments: Patient presents with pain and swelling the rest of her face since last night. She has frequent visits for dental pain. She states she had a knot 2 weeks ago that went away with some antibiotics she had at home. Denies any difficulty breathing or swallowing. No fevers or vomiting. She is nonerythematous. He states in the process of getting her Medicaid card.  The history is provided by the patient.    History reviewed. No pertinent past medical history.  Past Surgical History  Procedure Date  . Mouth surgery     Family History  Problem Relation Age of Onset  . Cancer Other     History  Substance Use Topics  . Smoking status: Current Every Day Smoker -- 0.5 packs/day for 5 years    Types: Cigarettes  . Smokeless tobacco: Never Used  . Alcohol Use: No    OB History    Grav Para Term Preterm Abortions TAB SAB Ect Mult Living   2 2 2       2       Review of Systems  Constitutional: Negative for fever.  HENT: Positive for dental problem.   Respiratory: Negative for cough and shortness of breath.   Cardiovascular: Negative for chest pain.  Gastrointestinal: Negative for nausea and vomiting.  Musculoskeletal: Negative for back pain.  Skin: Negative for rash.  Neurological: Negative for dizziness and headaches.    Allergies  Coconut flavor and Hydrocodone  Home Medications   Current Outpatient Rx  Name  Route  Sig  Dispense  Refill  . ACETAMINOPHEN 500 MG PO TABS   Oral   Take 1,000 mg by mouth every 6 (six) hours as needed. For pain         . CLINDAMYCIN HCL 150 MG PO CAPS   Oral   Take 150 mg by mouth 4 (four) times daily.         . IMPLANON [place]   Subcutaneous  Inject into the skin.           Marland Kitchen MELOXICAM 7.5 MG PO TABS      1 po bid with food   12 tablet   0   . NAPROXEN SODIUM 220 MG PO TABS   Oral   Take 220 mg by mouth 2 (two) times daily as needed. Pain         . OXYCODONE-ACETAMINOPHEN 5-325 MG PO TABS   Oral   Take 1 tablet by mouth every 6 (six) hours as needed for pain.   20 tablet   0     BP 100/63  Pulse 92  Temp 97.9 F (36.6 C) (Oral)  Resp 20  Ht 5\' 5"  (1.651 m)  Wt 147 lb (66.679 kg)  BMI 24.46 kg/m2  SpO2 100%  Physical Exam  Constitutional: She is oriented to person, place, and time. She appears well-developed and well-nourished. No distress.  HENT:  Head: Normocephalic and atraumatic.  Mouth/Throat: Oropharynx is clear and moist. No oropharyngeal exudate.       Mild R sided facial swelling.  Large Carie R lower molar. Floor of mouth soft, no trismus.  Eyes: Conjunctivae normal and EOM are normal. Pupils are equal, round, and reactive to light.  Neck: Normal range of motion. Neck supple.  Cardiovascular: Normal rate, regular rhythm and normal heart sounds.   No murmur heard. Pulmonary/Chest: Effort normal and breath sounds normal. No respiratory distress.  Abdominal: Soft. There is no tenderness. There is no rebound and no guarding.  Musculoskeletal: Normal range of motion. She exhibits no edema and no tenderness.  Neurological: She is alert and oriented to person, place, and time. No cranial nerve deficit. She exhibits normal muscle tone. Coordination normal.  Skin: Skin is warm.    ED Course  Procedures (including critical care time)  Labs Reviewed - No data to display No results found.   No diagnosis found.    MDM  Acute on chronic dental pain with frequent visits. Vital stable, no distress, floor of mouth soft no evidence of ludwig's angina no difficulty breathing or swallowing.  Poor dentition, multiple caries, need extraction.  [place] narcotic database shows multiple small narcotic  prescriptions. Dental referral.        Glynn Octave, MD 07/16/12 1409

## 2012-07-16 NOTE — ED Notes (Signed)
Reports swelling to right side of face x 2 wks ago - reports took abx that she had at home and knot went away.  States knot and pain came back last night.

## 2012-07-23 ENCOUNTER — Emergency Department (HOSPITAL_COMMUNITY)
Admission: EM | Admit: 2012-07-23 | Discharge: 2012-07-23 | Disposition: A | Discharge status: Home / Self Care | Payer: Medicaid Other | Attending: Emergency Medicine | Admitting: Emergency Medicine

## 2012-07-23 ENCOUNTER — Encounter (HOSPITAL_COMMUNITY): Payer: Self-pay | Admitting: *Deleted

## 2012-07-23 DIAGNOSIS — Z79899 Other long term (current) drug therapy: Secondary | ICD-10-CM | POA: Insufficient documentation

## 2012-07-23 DIAGNOSIS — F17200 Nicotine dependence, unspecified, uncomplicated: Secondary | ICD-10-CM | POA: Insufficient documentation

## 2012-07-23 DIAGNOSIS — K0889 Other specified disorders of teeth and supporting structures: Secondary | ICD-10-CM

## 2012-07-23 DIAGNOSIS — K029 Dental caries, unspecified: Secondary | ICD-10-CM | POA: Insufficient documentation

## 2012-07-23 DIAGNOSIS — R609 Edema, unspecified: Secondary | ICD-10-CM | POA: Insufficient documentation

## 2012-07-23 MED ORDER — MELOXICAM 7.5 MG PO TABS: ORAL_TABLET | ORAL | Status: DC

## 2012-07-23 MED ORDER — OXYCODONE-ACETAMINOPHEN 5-325 MG PO TABS: 1.0000 | ORAL_TABLET | Freq: Four times a day (QID) | ORAL | Status: AC | PRN

## 2012-07-23 NOTE — ED Notes (Signed)
Pt presents to er with continued dental pain to right side of face area, was seen in er on 07/16/2012, states that she has not been able to see a dentist yet she is waiting on medicaid

## 2012-07-23 NOTE — ED Provider Notes (Signed)
History     CSN: [id number]  Arrival date & time 07/23/12  1400   First MD Initiated Contact with Patient 07/23/12 1446      Chief Complaint  Patient presents with  . Dental Pain    (Consider location/radiation/quality/duration/timing/severity/associated sxs/prior treatment) Patient is a 27 y.o. female presenting with tooth pain. The history is provided by the patient.  Dental PainThe primary symptoms include mouth pain. Primary symptoms do not include shortness of breath or cough. The symptoms began more than 1 month ago. The symptoms are unchanged. The symptoms occur frequently.  Additional symptoms include: gum swelling, jaw pain and facial swelling. Additional symptoms do not include: nosebleeds. Medical issues include: smoking.    History reviewed. No pertinent past medical history.  Past Surgical History  Procedure Date  . Mouth surgery     Family History  Problem Relation Age of Onset  . Cancer Other     History  Substance Use Topics  . Smoking status: Current Every Day Smoker -- 0.5 packs/day for 5 years    Types: Cigarettes  . Smokeless tobacco: Never Used  . Alcohol Use: No    OB History    Grav Para Term Preterm Abortions TAB SAB Ect Mult Living   2 2 2       2       Review of Systems  Constitutional: Negative for activity change.       All ROS Neg except as noted in HPI  HENT: Positive for facial swelling and dental problem. Negative for nosebleeds and neck pain.   Eyes: Negative for photophobia and discharge.  Respiratory: Negative for cough, shortness of breath and wheezing.   Cardiovascular: Negative for chest pain and palpitations.  Gastrointestinal: Negative for abdominal pain and blood in stool.  Genitourinary: Negative for dysuria, frequency and hematuria.  Musculoskeletal: Negative for back pain and arthralgias.  Skin: Negative.   Neurological: Negative for dizziness, seizures and speech difficulty.  Psychiatric/Behavioral: Negative for  hallucinations and confusion.    Allergies  Coconut flavor and Hydrocodone  Home Medications   Current Outpatient Rx  Name  Route  Sig  Dispense  Refill  . ACETAMINOPHEN 500 MG PO TABS   Oral   Take 1,000 mg by mouth every 6 (six) hours as needed. For pain         . NAPROXEN SODIUM 220 MG PO TABS   Oral   Take 220 mg by mouth 2 (two) times daily as needed. Pain         . PENICILLIN V POTASSIUM 500 MG PO TABS   Oral   Take 1 tablet (500 mg total) by mouth 4 (four) times daily.   40 tablet   0   . IMPLANON [place]   Subcutaneous   Inject into the skin.             BP 113/74  Pulse 81  Temp 97.2 F (36.2 C)  Resp 18  SpO2 100%  Physical Exam  Nursing note and vitals reviewed. Constitutional: She is oriented to person, place, and time. She appears well-developed and well-nourished.  Non-toxic appearance.  HENT:  Head: Normocephalic.  Right Ear: Tympanic membrane and external ear normal.  Left Ear: Tympanic membrane and external ear normal.       The right and left lower gums show some mild to moderate swelling present. There there are multiple dental caries noted. There are deep cavities noted of the first and second molars the right lower jaw. There  is no visible abscess noted. Is no swelling under the tongue. The airway is patent.  Eyes: EOM and lids are normal. Pupils are equal, round, and reactive to light.  Neck: Normal range of motion. Neck supple. Carotid bruit is not present.  Cardiovascular: Normal rate, regular rhythm, normal heart sounds, intact distal pulses and normal pulses.   Pulmonary/Chest: Breath sounds normal. No respiratory distress.  Abdominal: Soft. Bowel sounds are normal. There is no tenderness. There is no guarding.  Musculoskeletal: Normal range of motion.  Lymphadenopathy:       Head (right side): No submandibular adenopathy present.       Head (left side): No submandibular adenopathy present.    She has no cervical adenopathy.    Neurological: She is alert and oriented to person, place, and time. She has normal strength. No cranial nerve deficit or sensory deficit.  Skin: Skin is warm and dry.  Psychiatric: She has a normal mood and affect. Her speech is normal.    ED Course  Procedures (including critical care time)  Labs Reviewed - No data to display No results found.   No diagnosis found.    MDM  I have reviewed nursing notes, vital signs, and all appropriate lab and imaging results for this patient. Patient states that she has had problems with her teeth for several months. She is waiting on her Medicaid to become active and oriented to see a dentist and have this corrected. The patient states that she has some of the penicillin available but continues to have swelling. She is mostly concerned because of the pain that is involved on the right side of her face and mouth facial swelling. Multiple cavities noted on examination. No Ludwig's angina on today's exam. The plan at this time is for the patient to finish her penicillin. Prescription for Mobic and Percocet one tablet every 6 hours #15 tablets given to the patient.       Kathie Dike, [place] 07/31/12 [phone]

## 2012-07-30 ENCOUNTER — Emergency Department (HOSPITAL_COMMUNITY)
Admission: EM | Admit: 2012-07-30 | Discharge: 2012-07-30 | Disposition: A | Discharge status: Home / Self Care | Payer: Medicaid Other | Attending: Emergency Medicine | Admitting: Emergency Medicine

## 2012-07-30 ENCOUNTER — Encounter (HOSPITAL_COMMUNITY): Payer: Self-pay | Admitting: Emergency Medicine

## 2012-07-30 DIAGNOSIS — K0889 Other specified disorders of teeth and supporting structures: Secondary | ICD-10-CM

## 2012-07-30 DIAGNOSIS — F17200 Nicotine dependence, unspecified, uncomplicated: Secondary | ICD-10-CM | POA: Insufficient documentation

## 2012-07-30 DIAGNOSIS — K089 Disorder of teeth and supporting structures, unspecified: Secondary | ICD-10-CM | POA: Insufficient documentation

## 2012-07-30 DIAGNOSIS — Z79899 Other long term (current) drug therapy: Secondary | ICD-10-CM | POA: Insufficient documentation

## 2012-07-30 MED ORDER — OXYCODONE-ACETAMINOPHEN 5-325 MG PO TABS
1.0000 | ORAL_TABLET | Freq: Once | ORAL | Status: AC
  Administered 2012-07-30: 1 via ORAL
  Filled 2012-07-30: qty 1

## 2012-07-30 MED ORDER — PENICILLIN V POTASSIUM 250 MG PO TABS
500.0000 mg | ORAL_TABLET | Freq: Once | ORAL | Status: AC
  Administered 2012-07-30: 500 mg via ORAL
  Filled 2012-07-30: qty 2

## 2012-07-30 MED ORDER — IBUPROFEN 800 MG PO TABS
800.0000 mg | ORAL_TABLET | Freq: Once | ORAL | Status: AC
  Administered 2012-07-30: 800 mg via ORAL
  Filled 2012-07-30: qty 1

## 2012-07-30 MED ORDER — IBUPROFEN 800 MG PO TABS: 800.0000 mg | ORAL_TABLET | Freq: Three times a day (TID) | ORAL | Status: DC

## 2012-07-30 MED ORDER — PENICILLIN V POTASSIUM 500 MG PO TABS: 500.0000 mg | ORAL_TABLET | Freq: Three times a day (TID) | ORAL | Status: DC

## 2012-07-30 MED ORDER — OXYCODONE-ACETAMINOPHEN 5-325 MG PO TABS: 1.0000 | ORAL_TABLET | ORAL | Status: DC | PRN

## 2012-07-30 NOTE — ED Notes (Signed)
Pt reports pain on right lower jaw. Reports swelling and pain for apx 1 month.

## 2012-07-30 NOTE — ED Notes (Signed)
Patient c/o right sided dental pain; states knot has been there x 30 days.  Patient states she just got her Medicaid card but dentist can't see her for 2 weeks and pain is increasing.

## 2012-07-30 NOTE — ED Provider Notes (Signed)
History     CSN: [id number]  Arrival date & time 07/30/12  2303   First MD Initiated Contact with Patient 07/30/12 2309      Chief Complaint  Patient presents with  . Dental Pain    (Consider location/radiation/quality/duration/timing/severity/associated sxs/prior treatment) HPI Brenda Keller is a 27 y.o. female who presents to the Emergency Department complaining of continued right sided jaw swelling and dental pain. She was seen last 07/16/2012 for the same complaint. She has since received her Medicaid card and is scheduled to see a dentist in one week. She has continued pain in teeth #18 and #31, both of which are broken with cavities. No obvious abscess. Pain to teeth is worse with eating. Denies fever, chills.  History reviewed. No pertinent past medical history.  Past Surgical History  Procedure Date  . Mouth surgery     Family History  Problem Relation Age of Onset  . Cancer Other     History  Substance Use Topics  . Smoking status: Current Every Day Smoker -- 0.5 packs/day for 5 years    Types: Cigarettes  . Smokeless tobacco: Never Used  . Alcohol Use: No    OB History    Grav Para Term Preterm Abortions TAB SAB Ect Mult Living   2 2 2       2       Review of Systems  Constitutional: Negative for fever.       10 Systems reviewed and are negative for acute change except as noted in the HPI.  HENT: Positive for dental problem. Negative for congestion.   Eyes: Negative for discharge and redness.  Respiratory: Negative for cough and shortness of breath.   Cardiovascular: Negative for chest pain.  Gastrointestinal: Negative for vomiting and abdominal pain.  Musculoskeletal: Negative for back pain.  Skin: Negative for rash.  Neurological: Negative for syncope, numbness and headaches.  Psychiatric/Behavioral:       No behavior change.    Allergies  Coconut flavor and Hydrocodone  Home Medications   Current Outpatient Rx  Name  Route  Sig  Dispense   Refill  . ACETAMINOPHEN 500 MG PO TABS   Oral   Take 1,000 mg by mouth every 6 (six) hours as needed. For pain         . IMPLANON [place]   Subcutaneous   Inject into the skin.           Marland Kitchen MELOXICAM 7.5 MG PO TABS      1 po bid with food   14 tablet   0   . NAPROXEN SODIUM 220 MG PO TABS   Oral   Take 220 mg by mouth 2 (two) times daily as needed. Pain         . OXYCODONE-ACETAMINOPHEN 5-325 MG PO TABS   Oral   Take 1 tablet by mouth every 6 (six) hours as needed for pain.   12 tablet   0     BP 114/67  Pulse 76  Temp 98 F (36.7 C) (Oral)  Resp 18  Ht 5\' 5"  (1.651 m)  Wt 147 lb (66.679 kg)  BMI 24.46 kg/m2  SpO2 100%  Physical Exam  Nursing note and vitals reviewed. Constitutional:       Awake, alert, nontoxic appearance.  HENT:  Head: Atraumatic.       Right sided jaw swelling to lower jaw. Teeth #18 , #31 with decay, and broken partially to gumline. No gum swelling.  Eyes: Right  eye exhibits no discharge. Left eye exhibits no discharge.  Neck: Neck supple.  Cardiovascular: Normal heart sounds.   Pulmonary/Chest: Effort normal and breath sounds normal. She exhibits no tenderness.  Abdominal: Soft. There is no tenderness. There is no rebound.  Musculoskeletal: She exhibits no tenderness.       Baseline ROM, no obvious new focal weakness.  Neurological:       Mental status and motor strength appears baseline for patient and situation.  Skin: No rash noted.  Psychiatric: She has a normal mood and affect.    ED Course  Procedures (including critical care time)     MDM  Patient with poor dentition here with recurrent pain to teeth #18 and #31. Scheduled to see a dentist in one week. Initiated antibiotic therapy. Given ibuprofen, penicillin and percocet. Pt stable in ED with no significant deterioration in condition.The patient appears reasonably screened and/or stabilized for discharge and I doubt any other medical condition or other [facility] requiring further  screening, evaluation, or treatment in the ED at this time prior to discharge.  MDM Reviewed: nursing note, vitals and previous chart           Nicoletta Dress. Colon Branch, MD 07/30/12 2342

## 2012-08-01 NOTE — ED Provider Notes (Signed)
Medical screening examination/treatment/procedure(s) were performed by non-physician practitioner and as supervising physician I was immediately available for consultation/collaboration.  Paddy Walthall, MD 08/01/12 1642 

## 2012-08-08 ENCOUNTER — Emergency Department (HOSPITAL_COMMUNITY)
Admission: EM | Admit: 2012-08-08 | Discharge: 2012-08-09 | Disposition: A | Discharge status: Home / Self Care | Payer: Medicaid Other | Attending: Emergency Medicine | Admitting: Emergency Medicine

## 2012-08-08 ENCOUNTER — Encounter (HOSPITAL_COMMUNITY): Payer: Self-pay | Admitting: Emergency Medicine

## 2012-08-08 DIAGNOSIS — K089 Disorder of teeth and supporting structures, unspecified: Secondary | ICD-10-CM | POA: Insufficient documentation

## 2012-08-08 DIAGNOSIS — K0889 Other specified disorders of teeth and supporting structures: Secondary | ICD-10-CM

## 2012-08-08 DIAGNOSIS — H9209 Otalgia, unspecified ear: Secondary | ICD-10-CM | POA: Insufficient documentation

## 2012-08-08 DIAGNOSIS — K029 Dental caries, unspecified: Secondary | ICD-10-CM | POA: Insufficient documentation

## 2012-08-08 DIAGNOSIS — R220 Localized swelling, mass and lump, head: Secondary | ICD-10-CM | POA: Insufficient documentation

## 2012-08-08 DIAGNOSIS — Z79899 Other long term (current) drug therapy: Secondary | ICD-10-CM | POA: Insufficient documentation

## 2012-08-08 DIAGNOSIS — F17200 Nicotine dependence, unspecified, uncomplicated: Secondary | ICD-10-CM | POA: Insufficient documentation

## 2012-08-08 MED ORDER — AMOXICILLIN 250 MG PO CAPS
500.0000 mg | ORAL_CAPSULE | Freq: Once | ORAL | Status: AC
  Administered 2012-08-09: 500 mg via ORAL
  Filled 2012-08-08: qty 2

## 2012-08-08 MED ORDER — OXYCODONE-ACETAMINOPHEN 5-325 MG PO TABS
1.0000 | ORAL_TABLET | Freq: Once | ORAL | Status: AC
  Administered 2012-08-09: 1 via ORAL
  Filled 2012-08-08: qty 1

## 2012-08-08 MED ORDER — AMOXICILLIN 500 MG PO CAPS: 500.0000 mg | ORAL_CAPSULE | Freq: Three times a day (TID) | ORAL | Status: DC

## 2012-08-08 MED ORDER — OXYCODONE-ACETAMINOPHEN 5-325 MG PO TABS: 2.0000 | ORAL_TABLET | ORAL | Status: DC | PRN

## 2012-08-08 NOTE — ED Notes (Signed)
Pt c/o dental pain x 6 months.

## 2012-08-08 NOTE — ED Provider Notes (Signed)
History     CSN: [id number]  Arrival date & time 08/08/12  2223   First MD Initiated Contact with Patient 08/08/12 2321      Chief Complaint  Patient presents with  . Dental Pain   HPI Brenda Keller is a 27 y.o. female who presents to the ED with dental pain. She has had problems with her wisdom teeth but tonight pain is severe with swelling in the right jaw. She recently got insurance and has an appointment next week with an oral Careers adviser. The history was provided by the patient.  History reviewed. No pertinent past medical history.  Past Surgical History  Procedure Date  . Mouth surgery     Family History  Problem Relation Age of Onset  . Cancer Other     History  Substance Use Topics  . Smoking status: Current Every Day Smoker -- 0.5 packs/day for 5 years    Types: Cigarettes  . Smokeless tobacco: Never Used  . Alcohol Use: No    OB History    Grav Para Term Preterm Abortions TAB SAB Ect Mult Living   2 2 2       2       Review of Systems  HENT: Positive for ear pain, facial swelling and dental problem.   Eyes: Negative.   Respiratory: Negative for cough and wheezing.   Cardiovascular: Negative for chest pain.  Gastrointestinal: Negative for nausea, vomiting and abdominal pain.  Genitourinary: Negative.   Musculoskeletal: Negative for back pain.  Skin: Negative.   Neurological: Negative for dizziness and seizures.  Psychiatric/Behavioral: Negative for confusion. The patient is not nervous/anxious.     Allergies  Coconut flavor and Hydrocodone  Home Medications   Current Outpatient Rx  Name  Route  Sig  Dispense  Refill  . ACETAMINOPHEN 500 MG PO TABS   Oral   Take 1,000 mg by mouth every 6 (six) hours as needed. For pain         . IMPLANON [place]   Subcutaneous   Inject into the skin.           . IBUPROFEN 800 MG PO TABS   Oral   Take 1 tablet (800 mg total) by mouth 3 (three) times daily.   21 tablet   0   . MELOXICAM 7.5 MG PO TABS     1 po bid with food   14 tablet   0   . NAPROXEN SODIUM 220 MG PO TABS   Oral   Take 220 mg by mouth 2 (two) times daily as needed. Pain         . OXYCODONE-ACETAMINOPHEN 5-325 MG PO TABS   Oral   Take 1 tablet by mouth every 4 (four) hours as needed for pain.   15 tablet   0   . PENICILLIN V POTASSIUM 500 MG PO TABS   Oral   Take 1 tablet (500 mg total) by mouth 3 (three) times daily.   30 tablet   0     BP 109/64  Pulse 98  Temp 97.9 F (36.6 C) (Oral)  Resp 16  Ht 5\' 5"  (1.651 m)  Wt 140 lb (63.504 kg)  BMI 23.30 kg/m2  SpO2 100%  Physical Exam  Nursing note and vitals reviewed. Constitutional: She is oriented to person, place, and time. She appears well-developed and well-nourished.  HENT:  Head: Normocephalic and atraumatic.  Right Ear: Tympanic membrane and external ear normal.  Left Ear: Tympanic membrane and  external ear normal.  Mouth/Throat: Uvula is midline, oropharynx is clear and moist and mucous membranes are normal. No oropharyngeal exudate.         Swelling noted in gum area surrounding the lower third molar right.  Eyes: EOM are normal. Pupils are equal, round, and reactive to light.  Neck: Neck supple.  Cardiovascular: Normal rate and regular rhythm.   Pulmonary/Chest: No respiratory distress. She has no wheezes.  Abdominal: Soft. There is no tenderness.  Musculoskeletal: Normal range of motion. She exhibits no edema.  Neurological: She is alert and oriented to person, place, and time. No cranial nerve deficit.  Skin: Skin is warm and dry.  Psychiatric: She has a normal mood and affect. Her behavior is normal. Judgment and thought content normal.   Procedures  Assessment: 27 y.o. female with dental pain   Dental decay  Plan:  Antibiotics   Pain management. Discussed with the patient and all questioned fully answered.    Medication List     As of 08/08/2012 11:41 PM    START taking these medications         amoxicillin 500 MG  capsule   Commonly known as: AMOXIL   Take 1 capsule (500 mg total) by mouth 3 (three) times daily.      * oxyCODONE-acetaminophen 5-325 MG per tablet   Commonly known as: PERCOCET/ROXICET   Take 2 tablets by mouth every 4 (four) hours as needed for pain.     * Notice: This list has 1 medication(s) that are the same as other medications prescribed for you. Read the directions carefully, and ask your doctor or other care provider to review them with you.    ASK your doctor about these medications         acetaminophen 500 MG tablet   Commonly known as: TYLENOL      ALEVE 220 MG tablet   Generic drug: naproxen sodium      ibuprofen 800 MG tablet   Commonly known as: ADVIL,MOTRIN   Take 1 tablet (800 mg total) by mouth 3 (three) times daily.      IMPLANON [place]      meloxicam 7.5 MG tablet   Commonly known as: MOBIC   1 po bid with food      * oxyCODONE-acetaminophen 5-325 MG per tablet   Commonly known as: PERCOCET/ROXICET   Take 1 tablet by mouth every 4 (four) hours as needed for pain.      penicillin v potassium 500 MG tablet   Commonly known as: VEETID   Take 1 tablet (500 mg total) by mouth 3 (three) times daily.     * Notice: This list has 1 medication(s) that are the same as other medications prescribed for you. Read the directions carefully, and ask your doctor or other care provider to review them with you.        Where to get your medications    These are the prescriptions that you need to pick up.   You may get these medications from any pharmacy.         amoxicillin 500 MG capsule   oxyCODONE-acetaminophen 5-325 MG per tablet             Janne Napoleon, NP 08/08/12 2341

## 2012-08-09 NOTE — ED Provider Notes (Signed)
Medical screening examination/treatment/procedure(s) were performed by non-physician practitioner and as supervising physician I was immediately available for consultation/collaboration.   Dione Booze, MD 08/09/12 [phone]

## 2013-06-26 ENCOUNTER — Emergency Department (HOSPITAL_COMMUNITY)
Admission: EM | Admit: 2013-06-26 | Discharge: 2013-06-27 | Disposition: A | Discharge status: Home / Self Care | Payer: MEDICAID | Attending: Emergency Medicine | Admitting: Emergency Medicine

## 2013-06-26 ENCOUNTER — Encounter (HOSPITAL_COMMUNITY): Payer: Self-pay | Admitting: Emergency Medicine

## 2013-06-26 DIAGNOSIS — Z79899 Other long term (current) drug therapy: Secondary | ICD-10-CM | POA: Insufficient documentation

## 2013-06-26 DIAGNOSIS — Z3202 Encounter for pregnancy test, result negative: Secondary | ICD-10-CM | POA: Insufficient documentation

## 2013-06-26 DIAGNOSIS — F1110 Opioid abuse, uncomplicated: Secondary | ICD-10-CM

## 2013-06-26 DIAGNOSIS — F17200 Nicotine dependence, unspecified, uncomplicated: Secondary | ICD-10-CM | POA: Insufficient documentation

## 2013-06-26 DIAGNOSIS — F1120 Opioid dependence, uncomplicated: Secondary | ICD-10-CM | POA: Insufficient documentation

## 2013-06-26 LAB — COMPREHENSIVE METABOLIC PANEL
ALT: 57 U/L — ABNORMAL HIGH (ref 0–35)
Alkaline Phosphatase: 89 U/L (ref 39–117)
CO2: 30 mEq/L (ref 19–32)
GFR calc Af Amer: >90 mL/min (ref >90)
GFR calc non Af Amer: >90 mL/min (ref >90)
Glucose, Bld: 92 mg/dL (ref 70–99)
Potassium: 3.9 mEq/L (ref 3.5–5.1)
Sodium: 138 mEq/L (ref 135–145)
Total Bilirubin: 0.2 mg/dL — ABNORMAL LOW (ref 0.3–1.2)

## 2013-06-26 LAB — RAPID URINE DRUG SCREEN, HOSP PERFORMED
Barbiturates: NOT DETECTED
Tetrahydrocannabinol: POSITIVE — AB

## 2013-06-26 LAB — CBC
Hemoglobin: 10.9 g/dL — ABNORMAL LOW (ref 12.0–15.0)
MCH: 28.2 pg (ref 26.0–34.0)
RBC: 3.86 MIL/uL — ABNORMAL LOW (ref 3.87–5.11)

## 2013-06-26 MED ORDER — DICYCLOMINE HCL 20 MG PO TABS: 20.0000 mg | ORAL_TABLET | Freq: Four times a day (QID) | ORAL | Status: DC | PRN

## 2013-06-26 MED ORDER — METHOCARBAMOL 500 MG PO TABS: 500.0000 mg | ORAL_TABLET | Freq: Three times a day (TID) | ORAL | Status: DC | PRN

## 2013-06-26 MED ORDER — IBUPROFEN 200 MG PO TABS: 600.0000 mg | ORAL_TABLET | Freq: Three times a day (TID) | ORAL | Status: DC | PRN

## 2013-06-26 MED ORDER — ONDANSETRON 4 MG PO TBDP: 4.0000 mg | ORAL_TABLET | Freq: Four times a day (QID) | ORAL | Status: DC | PRN

## 2013-06-26 MED ORDER — ONDANSETRON HCL 4 MG PO TABS: 4.0000 mg | ORAL_TABLET | Freq: Three times a day (TID) | ORAL | Status: DC | PRN

## 2013-06-26 MED ORDER — ALUM & MAG HYDROXIDE-SIMETH 200-200-20 MG/5ML PO SUSP: 30.0000 mL | ORAL | Status: DC | PRN

## 2013-06-26 MED ORDER — ACETAMINOPHEN 325 MG PO TABS: 650.0000 mg | ORAL_TABLET | ORAL | Status: DC | PRN

## 2013-06-26 MED ORDER — CLONIDINE HCL 0.1 MG PO TABS
0.1000 mg | ORAL_TABLET | Freq: Four times a day (QID) | ORAL | Status: DC
  Administered 2013-06-26 (×2): 0.1 mg via ORAL
  Filled 2013-06-26 (×2): qty 1

## 2013-06-26 MED ORDER — NAPROXEN 500 MG PO TABS
500.0000 mg | ORAL_TABLET | Freq: Two times a day (BID) | ORAL | Status: DC | PRN
  Filled 2013-06-26: qty 1

## 2013-06-26 MED ORDER — NICOTINE 21 MG/24HR TD PT24
21.0000 mg | MEDICATED_PATCH | Freq: Every day | TRANSDERMAL | Status: DC
  Administered 2013-06-26: 21 mg via TRANSDERMAL
  Filled 2013-06-26: qty 1

## 2013-06-26 MED ORDER — CLONIDINE HCL 0.1 MG PO TABS: 0.1000 mg | ORAL_TABLET | ORAL | Status: DC

## 2013-06-26 MED ORDER — ZOLPIDEM TARTRATE 5 MG PO TABS: 5.0000 mg | ORAL_TABLET | Freq: Every evening | ORAL | Status: DC | PRN

## 2013-06-26 MED ORDER — CLONIDINE HCL 0.1 MG PO TABS: 0.1000 mg | ORAL_TABLET | Freq: Every day | ORAL | Status: DC

## 2013-06-26 MED ORDER — HYDROXYZINE HCL 25 MG PO TABS
25.0000 mg | ORAL_TABLET | Freq: Four times a day (QID) | ORAL | Status: DC | PRN
  Administered 2013-06-26: 25 mg via ORAL
  Filled 2013-06-26: qty 1

## 2013-06-26 MED ORDER — LOPERAMIDE HCL 2 MG PO CAPS: 2.0000 mg | ORAL_CAPSULE | ORAL | Status: DC | PRN

## 2013-06-26 MED ORDER — CITALOPRAM HYDROBROMIDE 20 MG PO TABS
20.0000 mg | ORAL_TABLET | Freq: Every day | ORAL | Status: DC
  Administered 2013-06-26: 20 mg via ORAL
  Filled 2013-06-26 (×3): qty 1

## 2013-06-26 MED ORDER — LORAZEPAM 1 MG PO TABS: 1.0000 mg | ORAL_TABLET | Freq: Three times a day (TID) | ORAL | Status: DC | PRN

## 2013-06-26 NOTE — ED Notes (Signed)
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 Last use was yesterday morning. Pt is cooperative, A&Ox4, respirations equal and unlabored, skin warm and dry. Mom is at bedside

## 2013-06-26 NOTE — BH Assessment (Signed)
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  Patient reports she has been using narcotics initially by snorting.  Patient reports that she is now then injecting pain pills.  Patient reports that she has been using since she was 27 years old.    Patient reports that her last use was yesterday morning.  Patient was not able to state how much she uses.  Patient was only able to state that she uses as much as she can get her hands on.  Patient reports that she uses daily.  Patient repots a prior detox at [facility] in November 2013.  Patient reports that her longest period of sobriety was for one month.   Patient BAL is <11.  Patients UDS is positive for opiates and marijuana.  Patient has been accepted to [facility] for rehab but must go through detox first. Patient reports withdrawal symptoms to include slight nausea, weakness, sweats, fevers.     Pt denies SI/HI. Patient denies psychosis.  Patient reports a diagnosis of Depression and medication management.  Patient reports that she is not compliant with taking her medication.  Patient denies any psychiatric hospitalizations.  Patient is able to contract for safety.     Axis I: Opiate Dependence and Major Depressive Disorder  Axis II: Deferred Axis III: History reviewed. No pertinent past medical history. Axis IV: economic problems, other psychosocial or environmental problems, problems related to social environment and problems with primary support group Axis V: 31-40 impairment in reality testing  Past Medical History: History reviewed. No pertinent past medical history.  Past Surgical History  Procedure Laterality Date  . Mouth surgery      Family History:  Family History  Problem Relation Age of Onset  . Cancer Other     Social History:  reports that she has been smoking Cigarettes.  She has a 2.5 pack-year smoking history. She has never used smokeless tobacco. She reports that  she does not drink alcohol or use illicit drugs.  Additional Social History:     CIWA: CIWA-Ar BP: 112/68 mmHg Pulse Rate: 89 COWS:    Allergies:  Allergies  Allergen Reactions  . Coconut Flavor Itching and Other (See Comments)    Water blisters  . Hydrocodone Nausea And Vomiting    Home Medications:  (Not in a hospital admission)  OB/GYN Status:  No LMP recorded. Patient has had an implant.  General Assessment Data Location of Assessment: [facility] ED Is this a Tele or Face-to-Face Assessment?: Face-to-Face Is this an Initial Assessment or a Re-assessment for this encounter?: Initial Assessment Living Arrangements: Alone (With her 2 children.) Can pt return to current living arrangement?: Yes Admission Status: Voluntary Is patient capable of signing voluntary admission?: Yes Transfer from: Acute Hospital Referral Source: Self/Family/Friend  Medical Screening Exam [facility] Walk-in ONLY) Medical Exam completed: Yes  [facility] Crisis Care Plan Living Arrangements: Alone (With her 2 children.) Name of Psychiatrist: None Reported Name of Therapist: None Reported  Education Status Is patient currently in school?: No Current Grade: NA Highest grade of school patient has completed: NA Name of school: NA Contact person: NA  Risk to self Suicidal Ideation: No Suicidal Intent: No Is patient at risk for suicide?: No Suicidal Plan?: No Access to Means: No What has been your use of drugs/alcohol within the last 12 months?: Opiates Previous Attempts/Gestures: No How many times?: 0 Other Self Harm Risks: None Triggers for Past Attempts: Unpredictable Intentional Self  Injurious Behavior: None Family Suicide History: No Recent stressful life event(s): Financial Problems Persecutory voices/beliefs?: No Depression: Yes Depression Symptoms: Tearfulness;Fatigue (Depressed because she is not able to stop using drugs.) Substance abuse history and/or treatment for substance abuse?:  Yes Suicide prevention information given to non-admitted patients: Not applicable  Risk to Others Homicidal Ideation: No Thoughts of Harm to Others: No Current Homicidal Intent: No Current Homicidal Plan: No Access to Homicidal Means: No Identified Victim: None Reported History of harm to others?: No Assessment of Violence: None Noted Violent Behavior Description: Calm Does patient have access to weapons?: No Criminal Charges Pending?: No Does patient have a court date: No  Psychosis Hallucinations: None noted Delusions: None noted  Mental Status Report Appear/Hygiene: Disheveled Eye Contact: Good Motor Activity: Freedom of movement Speech: Logical/coherent Level of Consciousness: Quiet/awake Mood: Anxious;Despair Affect: Appropriate to circumstance Anxiety Level: Minimal Thought Processes: Coherent;Relevant Judgement: Unimpaired Orientation: Person;Place;Time;Situation Obsessive Compulsive Thoughts/Behaviors: None  Cognitive Functioning Concentration: Decreased Memory: Recent Intact;Remote Intact IQ: Average Insight: Fair Impulse Control: Poor Appetite: Poor Weight Loss: 5 Weight Gain: 0 Sleep: Decreased Total Hours of Sleep: 3 Vegetative Symptoms: Decreased grooming  ADLScreening [facility]) Patient's cognitive ability adequate to safely complete daily activities?: Yes Patient able to express need for assistance with ADLs?: Yes Independently performs ADLs?: Yes (appropriate for developmental age)  Prior Inpatient Therapy Prior Inpatient Therapy: Yes Prior Therapy Dates: November 2013 Prior Therapy Facilty/Provider(s): [facility] Reason for Treatment: Detox  Prior Outpatient Therapy Prior Outpatient Therapy: Yes Prior Therapy Dates: ongoing  Prior Therapy Facilty/Provider(s): Dr. Ayesha Mohair in [place] Reason for Treatment: Depression   ADL Screening (condition at time of admission) Patient's cognitive ability adequate to safely complete daily  activities?: Yes Patient able to express need for assistance with ADLs?: Yes Independently performs ADLs?: Yes (appropriate for developmental age)         Values / Beliefs Cultural Requests During Hospitalization: None Spiritual Requests During Hospitalization: None        Additional Information 1:1 In Past 12 Months?: No CIRT Risk: No Elopement Risk: No Does patient have medical clearance?: Yes     Disposition:  Disposition Initial Assessment Completed for this Encounter: Yes Disposition of Patient: Inpatient treatment program Type of inpatient treatment program: Adult  On Site Evaluation by:   Reviewed with Physician:    Phillip Heal LaVerne 06/26/2013 5:58 PM

## 2013-06-26 NOTE — ED Notes (Signed)
Pt belongings sent home with mother. 

## 2013-06-26 NOTE — ED Provider Notes (Signed)
CSN: [id number]     Arrival date & time 06/26/13  1319 History   First MD Initiated Contact with Patient 06/26/13 1501     Chief Complaint  Patient presents with  . Medical Clearance   (Consider location/radiation/quality/duration/timing/severity/associated sxs/prior Treatment) HPI Pt presents with c/o request for detox.  Pt states she has been using narcotics- intially snorting, then progressed to injecting narcotics.  She has been to [facility] and has been accepted for rehab but must go through detox first. Pt denies SI/HI.  She denies any recent illness- no fever, no cough or vomiting.  Last use of narcotics was yesterday morning.  There are no other associated systemic symptoms, there are no other alleviating or modifying factors.   History reviewed. No pertinent past medical history. Past Surgical History  Procedure Laterality Date  . Mouth surgery     Family History  Problem Relation Age of Onset  . Cancer Other    History  Substance Use Topics  . Smoking status: Current Every Day Smoker -- 0.50 packs/day for 5 years    Types: Cigarettes  . Smokeless tobacco: Never Used  . Alcohol Use: No   OB History   Grav Para Term Preterm Abortions TAB SAB Ect Mult Living   2 2 2       2      Review of Systems ROS reviewed and all otherwise negative except for mentioned in HPI  Allergies  Coconut flavor and Hydrocodone  Home Medications   Current Outpatient Rx  Name  Route  Sig  Dispense  Refill  . citalopram (CELEXA) 20 MG tablet   Oral   Take 20 mg by mouth daily.         . Etonogestrel (IMPLANON [place])   Subcutaneous   Inject into the skin.            BP 112/68  Pulse 89  Temp(Src) 97.8 F (36.6 C) (Oral)  Resp 18  SpO2 98% Vitals reviewed Physical Exam Physical Examination: General appearance - alert, well appearing, and in no distress Mental status - alert, oriented to person, place, and time Eyes - no conjunctival injection, no scleral icterus Mouth -  mucous membranes moist, pharynx normal without lesions Chest - clear to auscultation, no wheezes, rales or rhonchi, symmetric air entry Heart - normal rate, regular rhythm, normal S1, S2, no murmurs, rubs, clicks or gallops Abdomen - soft, nontender, nondistended, no masses or organomegaly Extremities - peripheral pulses normal, no pedal edema, no clubbing or cyanosis Skin - normal coloration and turgor, no rashes  ED Course  Procedures (including critical care time) 6:29 PM d/w [facility], pt has been accepted for detox at [facility]- she will be going tomorrow morning at 8am.  On clonidine protocol now in the psych ED.   Labs Review Labs Reviewed  CBC - Abnormal; Notable for the following:    RBC 3.86 (*)    Hemoglobin 10.9 (*)    HCT 33.4 (*)    All other components within normal limits  COMPREHENSIVE METABOLIC PANEL - Abnormal; Notable for the following:    Albumin 3.3 (*)    AST 67 (*)    ALT 57 (*)    Total Bilirubin 0.2 (*)    All other components within normal limits  SALICYLATE LEVEL - Abnormal; Notable for the following:    Salicylate Lvl <2.0 (*)    All other components within normal limits  URINE RAPID DRUG SCREEN (HOSP PERFORMED) - Abnormal; Notable for the following:  Opiates POSITIVE (*)    Tetrahydrocannabinol POSITIVE (*)    All other components within normal limits  ACETAMINOPHEN LEVEL  ETHANOL  POCT PREGNANCY, URINE   Imaging Review No results found.  EKG Interpretation   None       MDM   1. Opiate dependence    Pt presenting with request for detox from [facility].  Pt has been seen by psych and arrangements have been made for her to go to [facility] in the morning for inpatient detox.  Pt has been placed on clonidine protocol overnight.      Ethelda Chick, MD 06/26/13 [phone]

## 2013-06-26 NOTE — Progress Notes (Signed)
Writer consulted with the NP Denice Bors) regarding the patient meeting criteria for inpatient hospitalization for detox.  Writer was informed by Andrey Campanile) at [facility] [phone]) that the patient has been accepted pending detox.  The NP has started the patient on Clondine due to her withdrawal symptoms.    Andrey Campanile reports that the patient bed will be held.  The mother reports that she will be able to transport the patient to [facility] tomorrow.    Writer informed the nurse Tresa Endo) and the ER MD (Dr. Karma Ganja) of the patients disposition.

## 2013-06-26 NOTE — Consult Note (Signed)
  Subjective:  Patient states that she started snorting pain pills using drugs when she was 27 yr old.  "I started doing drugs and snorting pain pills when I was 27 yr old and I started shooting up pain pills in March.  I do it everyday sometimes 30 dollars and sometime 120 dollars.  I really want to stop and get my life together.  I don't want to do this anymore."  Patient states that she has attempted to stop on multiple occassions but unable to.  "Me and my children moved in with my mom 2 weeks ago so that I could get some help."  Patient endorses depression which has been worsening over the years.  Patient denies suicidal/homicide ideation,  Psychosis, and paranoia.   Patient does have a history of prior detox and rehab and was clean for 1 month afterwards.  "I wasn't serous then I was worried to much about my baby's daddy; I am serous now." Axis I: Substance Abuse Axis II: Deferred Axis III: History reviewed. No pertinent past medical history. Axis IV: other psychosocial or environmental problems and problems related to social environment Axis V: 41-50 serious symptoms  Psychiatric Specialty Exam: Physical Exam  ROS  Blood pressure 112/68, pulse 89, temperature 97.8 F (36.6 C), temperature source Oral, resp. rate 18, SpO2 98.00%.There is no weight on file to calculate BMI.  General Appearance: Casual  Eye Contact::  Good  Speech:  Clear and Coherent and Normal Rate  Volume:  Normal  Mood:  Anxious and Depressed  Affect:  Congruent and Depressed  Thought Process:  Circumstantial and Goal Directed  Orientation:  Full (Time, Place, and Person)  Thought Content:  "I just want to get my life together for my children"  Suicidal Thoughts:  No  Homicidal Thoughts:  No  Memory:  Immediate;   Good Recent;   Good Remote;   Good  Judgement:  Fair  Insight:  Fair  Psychomotor Activity:  Tremor  Concentration:  Fair  Recall:  Good  Akathisia:  No  Handed:  Right  AIMS (if indicated):      Assets:  Communication Skills Desire for Improvement Housing Social Support  Sleep:      Face to face consult/interview   Disposition:  Will start patient on Clondine detox protocol tonight and monitor over night.   Patient has been accept to [facility] for rehab.  Spoke to [employer] and informed that we could start her on the Clonidine tonight patient discharge to [facility] in the morning and in agreement with plan.  Patient will be picked up by mother and transported to [facility] in the morning.     Marke Goodwyn B. Marise Knapper FNP-BC Family Nurse Practitioner, Board Certified

## 2013-06-27 NOTE — Progress Notes (Signed)
Writer spoke with Joni Reining at [employer]. Patient to accepted to [facility]. Per Joni Reining, they have received everything they need including cardinal authorization however this writer does not have any notation of that being completed. Joni Reining at [facility] stated she would check and would  call this writer back if authorization is needed.   Catha Gosselin, LCSW [phone]  ED CSW .06/27/2013 802am

## 2013-06-28 NOTE — Consult Note (Signed)
Discussed, agreeable with plan

## 2013-12-18 ENCOUNTER — Other Ambulatory Visit: Payer: Self-pay | Admitting: Advanced Practice Midwife

## 2014-05-18 ENCOUNTER — Encounter (HOSPITAL_COMMUNITY): Payer: Self-pay | Admitting: Emergency Medicine

## 2014-11-23 ENCOUNTER — Inpatient Hospital Stay (HOSPITAL_COMMUNITY)
Admission: EM | Admit: 2014-11-23 | Discharge: 2014-11-26 | DRG: 871 | Disposition: A | Discharge status: Home / Self Care | Payer: Self-pay | Attending: Internal Medicine | Admitting: Internal Medicine

## 2014-11-23 ENCOUNTER — Emergency Department (HOSPITAL_COMMUNITY): Payer: Self-pay

## 2014-11-23 ENCOUNTER — Encounter (HOSPITAL_COMMUNITY): Payer: Self-pay | Admitting: Cardiology

## 2014-11-23 ENCOUNTER — Other Ambulatory Visit (HOSPITAL_COMMUNITY): Payer: Self-pay

## 2014-11-23 DIAGNOSIS — I38 Endocarditis, valve unspecified: Secondary | ICD-10-CM | POA: Diagnosis present

## 2014-11-23 DIAGNOSIS — I330 Acute and subacute infective endocarditis: Secondary | ICD-10-CM

## 2014-11-23 DIAGNOSIS — Z681 Body mass index (BMI) 19 or less, adult: Secondary | ICD-10-CM

## 2014-11-23 DIAGNOSIS — B958 Unspecified staphylococcus as the cause of diseases classified elsewhere: Secondary | ICD-10-CM

## 2014-11-23 DIAGNOSIS — A419 Sepsis, unspecified organism: Secondary | ICD-10-CM | POA: Diagnosis present

## 2014-11-23 DIAGNOSIS — L03113 Cellulitis of right upper limb: Secondary | ICD-10-CM | POA: Diagnosis present

## 2014-11-23 DIAGNOSIS — F1120 Opioid dependence, uncomplicated: Secondary | ICD-10-CM | POA: Diagnosis present

## 2014-11-23 DIAGNOSIS — D649 Anemia, unspecified: Secondary | ICD-10-CM | POA: Diagnosis present

## 2014-11-23 DIAGNOSIS — A4102 Sepsis due to Methicillin resistant Staphylococcus aureus: Principal | ICD-10-CM | POA: Diagnosis present

## 2014-11-23 DIAGNOSIS — F1129 Opioid dependence with unspecified opioid-induced disorder: Secondary | ICD-10-CM

## 2014-11-23 DIAGNOSIS — A4101 Sepsis due to Methicillin susceptible Staphylococcus aureus: Secondary | ICD-10-CM

## 2014-11-23 DIAGNOSIS — F17200 Nicotine dependence, unspecified, uncomplicated: Secondary | ICD-10-CM

## 2014-11-23 DIAGNOSIS — F17210 Nicotine dependence, cigarettes, uncomplicated: Secondary | ICD-10-CM | POA: Diagnosis present

## 2014-11-23 DIAGNOSIS — R509 Fever, unspecified: Secondary | ICD-10-CM | POA: Diagnosis present

## 2014-11-23 DIAGNOSIS — E876 Hypokalemia: Secondary | ICD-10-CM | POA: Diagnosis present

## 2014-11-23 DIAGNOSIS — E43 Unspecified severe protein-calorie malnutrition: Secondary | ICD-10-CM | POA: Insufficient documentation

## 2014-11-23 HISTORY — DX: Depression, unspecified: F32.A

## 2014-11-23 HISTORY — DX: Major depressive disorder, single episode, unspecified: F32.9

## 2014-11-23 LAB — COMPREHENSIVE METABOLIC PANEL
ALBUMIN: 3 g/dL — AB (ref 3.5–5.0)
ALT: 25 U/L (ref 14–54)
ANION GAP: 8 (ref 5–15)
AST: 46 U/L — ABNORMAL HIGH (ref 15–41)
Alkaline Phosphatase: 124 U/L (ref 38–126)
BUN: 7 mg/dL (ref 6–20)
CALCIUM: 8.4 mg/dL — AB (ref 8.9–10.3)
CO2: 26 mmol/L (ref 22–32)
Chloride: 101 mmol/L (ref 101–111)
Creatinine, Ser: 0.52 mg/dL (ref 0.44–1.00)
GFR calc non Af Amer: >60 mL/min (ref >60)
GLUCOSE: 114 mg/dL — AB (ref 70–99)
POTASSIUM: 3.3 mmol/L — AB (ref 3.5–5.1)
Sodium: 135 mmol/L (ref 135–145)
Total Bilirubin: 0.4 mg/dL (ref 0.3–1.2)
Total Protein: 7.1 g/dL (ref 6.5–8.1)

## 2014-11-23 LAB — CBC WITH DIFFERENTIAL/PLATELET
Basophils Absolute: 0 10*3/uL (ref 0.0–0.1)
Basophils Relative: 0 % (ref 0–1)
Eosinophils Absolute: 0 10*3/uL (ref 0.0–0.7)
Eosinophils Relative: 0 % (ref 0–5)
HCT: 29.2 % — ABNORMAL LOW (ref 36.0–46.0)
Hemoglobin: 9.3 g/dL — ABNORMAL LOW (ref 12.0–15.0)
LYMPHS PCT: 7 % — AB (ref 12–46)
Lymphs Abs: 0.9 10*3/uL (ref 0.7–4.0)
MCH: 26.7 pg (ref 26.0–34.0)
MCHC: 31.8 g/dL (ref 30.0–36.0)
MCV: 83.9 fL (ref 78.0–100.0)
Monocytes Absolute: 0.9 10*3/uL (ref 0.1–1.0)
Monocytes Relative: 7 % (ref 3–12)
NEUTROS PCT: 86 % — AB (ref 43–77)
Neutro Abs: 11.4 10*3/uL — ABNORMAL HIGH (ref 1.7–7.7)
PLATELETS: 298 10*3/uL (ref 150–400)
RBC: 3.48 MIL/uL — AB (ref 3.87–5.11)
RDW: 17.6 % — ABNORMAL HIGH (ref 11.5–15.5)
WBC: 13.2 10*3/uL — AB (ref 4.0–10.5)

## 2014-11-23 LAB — URINALYSIS, ROUTINE W REFLEX MICROSCOPIC
Bilirubin Urine: NEGATIVE
Glucose, UA: NEGATIVE mg/dL
Hgb urine dipstick: NEGATIVE
Ketones, ur: NEGATIVE mg/dL
Leukocytes, UA: NEGATIVE
NITRITE: NEGATIVE
PH: 5.5 (ref 5.0–8.0)
PROTEIN: NEGATIVE mg/dL
Specific Gravity, Urine: 1.025 (ref 1.005–1.030)
Urobilinogen, UA: 0.2 mg/dL (ref 0.0–1.0)

## 2014-11-23 LAB — RAPID URINE DRUG SCREEN, HOSP PERFORMED
AMPHETAMINES: NOT DETECTED
Barbiturates: NOT DETECTED
Benzodiazepines: NOT DETECTED
Cocaine: NOT DETECTED
Opiates: POSITIVE — AB
Tetrahydrocannabinol: NOT DETECTED

## 2014-11-23 LAB — PREGNANCY, URINE: Preg Test, Ur: NEGATIVE

## 2014-11-23 LAB — I-STAT CG4 LACTIC ACID, ED: Lactic Acid, Venous: 1.39 mmol/L (ref 0.5–2.0)

## 2014-11-23 MED ORDER — CITALOPRAM HYDROBROMIDE 20 MG PO TABS
20.0000 mg | ORAL_TABLET | Freq: Every day | ORAL | Status: DC
  Administered 2014-11-23 – 2014-11-25 (×3): 20 mg via ORAL
  Filled 2014-11-23 (×3): qty 1

## 2014-11-23 MED ORDER — ONDANSETRON HCL 4 MG/2ML IJ SOLN: 4.0000 mg | Freq: Four times a day (QID) | INTRAMUSCULAR | Status: DC | PRN

## 2014-11-23 MED ORDER — POTASSIUM CHLORIDE CRYS ER 20 MEQ PO TBCR
40.0000 meq | EXTENDED_RELEASE_TABLET | Freq: Once | ORAL | Status: AC
  Administered 2014-11-24: 40 meq via ORAL
  Filled 2014-11-23: qty 2

## 2014-11-23 MED ORDER — ENSURE ENLIVE PO LIQD
237.0000 mL | Freq: Two times a day (BID) | ORAL | Status: DC
  Administered 2014-11-24 – 2014-11-25 (×4): 237 mL via ORAL

## 2014-11-23 MED ORDER — ACETAMINOPHEN 650 MG RE SUPP: 650.0000 mg | Freq: Four times a day (QID) | RECTAL | Status: DC | PRN

## 2014-11-23 MED ORDER — PIPERACILLIN-TAZOBACTAM 3.375 G IVPB 30 MIN
3.3750 g | Freq: Once | INTRAVENOUS | Status: AC
  Administered 2014-11-23: 3.375 g via INTRAVENOUS
  Filled 2014-11-23: qty 50

## 2014-11-23 MED ORDER — NICOTINE 21 MG/24HR TD PT24
21.0000 mg | MEDICATED_PATCH | Freq: Every day | TRANSDERMAL | Status: DC
  Administered 2014-11-23 – 2014-11-25 (×4): 21 mg via TRANSDERMAL
  Filled 2014-11-23 (×4): qty 1

## 2014-11-23 MED ORDER — VANCOMYCIN HCL IN DEXTROSE 1-5 GM/200ML-% IV SOLN
1000.0000 mg | Freq: Once | INTRAVENOUS | Status: AC
  Administered 2014-11-23: 1000 mg via INTRAVENOUS
  Filled 2014-11-23: qty 200

## 2014-11-23 MED ORDER — VANCOMYCIN HCL 500 MG IV SOLR
500.0000 mg | Freq: Two times a day (BID) | INTRAVENOUS | Status: DC
  Administered 2014-11-24 – 2014-11-26 (×5): 500 mg via INTRAVENOUS
  Filled 2014-11-23 (×7): qty 500

## 2014-11-23 MED ORDER — SODIUM CHLORIDE 0.9 % IV BOLUS (SEPSIS): 500.0000 mL | Freq: Once | INTRAVENOUS | Status: DC

## 2014-11-23 MED ORDER — SODIUM CHLORIDE 0.9 % IV SOLN
INTRAVENOUS | Status: AC
  Administered 2014-11-23: 22:00:00 via INTRAVENOUS

## 2014-11-23 MED ORDER — ENOXAPARIN SODIUM 40 MG/0.4ML ~~LOC~~ SOLN
40.0000 mg | SUBCUTANEOUS | Status: DC
  Administered 2014-11-23 – 2014-11-24 (×2): 40 mg via SUBCUTANEOUS
  Filled 2014-11-23 (×2): qty 0.4

## 2014-11-23 MED ORDER — ACETAMINOPHEN 500 MG PO TABS
1000.0000 mg | ORAL_TABLET | Freq: Once | ORAL | Status: AC
  Administered 2014-11-23: 1000 mg via ORAL

## 2014-11-23 MED ORDER — ACETAMINOPHEN 500 MG PO TABS
ORAL_TABLET | ORAL | Status: AC
  Filled 2014-11-23: qty 2

## 2014-11-23 MED ORDER — ACETAMINOPHEN 325 MG PO TABS
650.0000 mg | ORAL_TABLET | Freq: Four times a day (QID) | ORAL | Status: DC | PRN
Start: 2014-11-23 — End: 2014-11-26
  Administered 2014-11-25: 650 mg via ORAL
  Filled 2014-11-23: qty 2

## 2014-11-23 MED ORDER — ONDANSETRON HCL 4 MG PO TABS: 4.0000 mg | ORAL_TABLET | Freq: Four times a day (QID) | ORAL | Status: DC | PRN

## 2014-11-23 MED ORDER — SODIUM CHLORIDE 0.9 % IV BOLUS (SEPSIS)
1000.0000 mL | Freq: Once | INTRAVENOUS | Status: AC
  Administered 2014-11-23: 1000 mL via INTRAVENOUS

## 2014-11-23 MED ORDER — VANCOMYCIN HCL 500 MG IV SOLR
INTRAVENOUS | Status: AC
  Filled 2014-11-23: qty 500

## 2014-11-23 MED ORDER — PIPERACILLIN-TAZOBACTAM 3.375 G IVPB
3.3750 g | Freq: Three times a day (TID) | INTRAVENOUS | Status: DC
  Administered 2014-11-24 – 2014-11-25 (×5): 3.375 g via INTRAVENOUS
  Filled 2014-11-23 (×8): qty 50

## 2014-11-23 NOTE — Progress Notes (Signed)
ANTIBIOTIC CONSULT NOTE - INITIAL  Pharmacy Consult for Vancomycin & Zosyn Indication: sepsis  Allergies  Allergen Reactions  . Coconut Flavor Itching and Other (See Comments)    Water blisters  . Hydrocodone Nausea And Vomiting    Patient Measurements: Height: 5\' 5"  (165.1 cm) Weight: 100 lb (45.36 kg) IBW/kg (Calculated) : 57 Adjusted Body Weight:   Vital Signs: Temp: 98.2 F (36.8 C) (05/09 2035) Temp Source: Oral (05/09 2035) BP: 89/50 mmHg (05/09 2035) Pulse Rate: 82 (05/09 2035) Intake/Output from previous day:   Intake/Output from this shift: Total I/O In: 240 [P.O.:240] Out: -   Labs:  Recent Labs  11/23/14 1650  WBC 13.2*  HGB 9.3*  PLT 298  CREATININE 0.52   Estimated Creatinine Clearance: 75 mL/min (by C-G formula based on Cr of 0.52). No results for input(s): VANCOTROUGH, VANCOPEAK, VANCORANDOM, GENTTROUGH, GENTPEAK, GENTRANDOM, TOBRATROUGH, TOBRAPEAK, TOBRARND, AMIKACINPEAK, AMIKACINTROU, AMIKACIN in the last 72 hours.   Microbiology: Recent Results (from the past 720 hour(s))  Culture, blood (routine x 2)     Status: None (Preliminary result)   Collection Time: 11/23/14  5:40 PM  Result Value Ref Range Status   Specimen Description BLOOD LEFT HAND  Final   Special Requests BOTTLES DRAWN AEROBIC AND ANAEROBIC [facility]  Final   Culture PENDING  Incomplete   Report Status PENDING  Incomplete    Medical History: History reviewed. No pertinent past medical history.  Medications:  Prescriptions prior to admission  Medication Sig Dispense Refill Last Dose  . ibuprofen (ADVIL,MOTRIN) 200 MG tablet Take 800 mg by mouth daily as needed for moderate pain.   11/22/2014 at Unknown time  . citalopram (CELEXA) 20 MG tablet Take 20 mg by mouth daily.   06/25/2013 at Unknown time  . Etonogestrel (IMPLANON [place]) Inject into the skin.     injected   Assessment: 29 y/o female history of IV drug abuse, complaint fever and right forearm abscess. Concern for possible  infective endocarditis due to drug abuse. Vancomycin 1 GM and Zosyn 3.375 GM given in ED  Goal of Therapy:  Vancomycin trough level 15-20 mcg/ml  Plan:  Zosyn 3.375 GM IV every 8 hours, infuse each dose over 4 hours Vancomycin 500 mg IV every 12 hours Vancomycin trough at steady state Labs per protocol  Raquel JamesPittman, Preston Weill Bennett 11/23/2014,9:09 PM

## 2014-11-23 NOTE — H&P (Signed)
PCP:   No PCP Per Patient   Chief Complaint:  Fever  HPI: 29 year old female with a history of IV drug abuse, came to the hospital with chief complaint of fever and right forearm abscess. Patient says that over the past few days she will redness and swelling in the forearm and there were 2 areas of swelling , one area drained of itself. Patient's grandfather recently died of sepsis and she got concerned that she may be having infection so she came to the hospital. She denies any chest pain, no shortness of breath had a fever of 103 and the hospital. No dysuria urgency frequency of urination. Patient also says that she has had abdominal pain last month which improved after she took medicine for constipation. In the ED patient found to be hypotensive with systolic blood pressure in 80s, sinus tachycardia with heart rate 130s, fever 103.1. Patient empirically started on vancomycin and Zosyn. Lactic acid 1.39, white count 13.2.  Allergies:   Allergies  Allergen Reactions  . Coconut Flavor Itching and Other (See Comments)    Water blisters  . Hydrocodone Nausea And Vomiting     History reviewed. No pertinent past medical history.  Past Surgical History  Procedure Laterality Date  . Mouth surgery      Prior to Admission medications   Medication Sig Start Date End Date Taking? Authorizing Provider  ibuprofen (ADVIL,MOTRIN) 200 MG tablet Take 800 mg by mouth daily as needed for moderate pain.   Yes Historical Provider, MD  citalopram (CELEXA) 20 MG tablet Take 20 mg by mouth daily.    Historical Provider, MD  Etonogestrel (IMPLANON ) Inject into the skin.      Historical Provider, MD    Social History:  reports that she has been smoking Cigarettes.  She has a 2.5 pack-year smoking history. She has never used smokeless tobacco. She reports that she does not drink alcohol or use illicit drugs.  Family History  Problem Relation Age of Onset  . Cancer Other    Review of Systems:    All the positives in history of present illness, rest are negative  Physical Exam: Blood pressure 89/50, pulse 82, temperature 98.2 F (36.8 C), temperature source Oral, resp. rate 16, height 5\' 5"  (1.651 m), weight 45.36 kg (100 lb), SpO2 100 %. Constitutional:   Patient is a well-developed and well-nourished female* in no acute distress and cooperative with exam. Head: Normocephalic and atraumatic Mouth: Mucus membranes moist Eyes: PERRL, EOMI, conjunctivae normal Neck: Supple, No Thyromegaly Cardiovascular: RRR, S1 normal, S2 normal Pulmonary/Chest: CTAB, no wheezes, rales, or rhonchi Abdominal: Soft. Non-tender, non-distended, bowel sounds are normal, no masses, organomegaly, or guarding present.  Neurological: A&O x3, Strength is normal and symmetric bilaterally, cranial nerve II-XII are grossly intact, no focal motor deficit, sensory intact to light touch bilaterally.  Extremities : 2 small wound in the right forearm, one over the distal aspect of ulna, with clear serosanguineous discharge, mild induration around the wound. Other noted on the mid forearm, mild induration with no discharge.  Labs on Admission:  Basic Metabolic Panel:  Recent Labs Lab 11/23/14 1650  NA 135  K 3.3*  CL 101  CO2 26  GLUCOSE 114*  BUN 7  CREATININE 0.52  CALCIUM 8.4*   Liver Function Tests:  Recent Labs Lab 11/23/14 1650  AST 46*  ALT 25  ALKPHOS 124  BILITOT 0.4  PROT 7.1  ALBUMIN 3.0*   No results for input(s): LIPASE, AMYLASE in the  last 168 hours. No results for input(s): AMMONIA in the last 168 hours. CBC:  Recent Labs Lab 11/23/14 1650  WBC 13.2*  NEUTROABS 11.4*  HGB 9.3*  HCT 29.2*  MCV 83.9  PLT 298   Radiological Exams on Admission: Dg Forearm Right  11/23/2014   CLINICAL DATA:  Soft tissue lesion of the right forearm, IV drug abuse  EXAM: RIGHT FOREARM - 2 VIEW  COMPARISON:  None.  FINDINGS: There is no evidence of fracture or other focal bone lesions. Soft  tissues are unremarkable. IV catheter and tubing project over the distal right forearm.  IMPRESSION: Negative.   Electronically Signed   By: Christiana PellantGretchen  Green M.D.   On: 11/23/2014 18:10   Dg Chest Portable 1 View  11/23/2014   CLINICAL DATA:  Right forearm pustules.  IV drug user.  EXAM: PORTABLE CHEST - 1 VIEW  COMPARISON:  None.  FINDINGS: A single AP portable view of the chest demonstrates no focal airspace consolidation or alveolar edema. The lungs are grossly clear. There is no large effusion or pneumothorax. Cardiac and mediastinal contours appear unremarkable.  IMPRESSION: No active disease.   Electronically Signed   By: Ellery Plunkaniel R Mitchell M.D.   On: 11/23/2014 18:10    EKG: Independently reviewed. Sinus tachycardia   Assessment/Plan Active Problems:   Opiate dependence   Sepsis   Fever  Fever in the setting of IV drug abuse, with open wound in the forearm Ultrasound of the forearm was performed by the ED physician Dr. Rubin PayorPickering, and he did not see any abscess. Blood cultures 2 have been obtained. There is a concern for possible infective endocarditis, due to history of IV drug abuse. Will admit the patient and obtain 2-D echo  in morning. Continue with IV antibiotics follow blood culture results.  Forearm wound Will get wound care consultation, patient does not have any numbness or tingling of the hand, no significant swelling noted. Range of motion of wrist is mildly limited on flexion of the wrist.  Hypokalemia Replace potassium and check BMP in a.m.  DVT prophylaxis Lovenox  Code status: Full code  Family discussion: Admission, patients condition and plan of care including tests being ordered have been discussed with the patient and her mother at bedside who indicate understanding and agree with the plan and Code Status.   Time Spent on Admission: 55 min  LAMA,GAGAN S [facility] Pager: [phone] 11/23/2014, 8:36 PM  If 7PM-7AM, please contact night-coverage    [email or web address]  Password [email or web address]

## 2014-11-23 NOTE — ED Notes (Addendum)
2 boils to right forearm.   C/o fever.  Tylenol given in triage.  Pt concerned recently lost a grandparent to sepsis.

## 2014-11-23 NOTE — ED Provider Notes (Signed)
CSN: [id number]     Arrival date & time 11/23/14  1553 History   First MD Initiated Contact with Patient 11/23/14 1658     Chief Complaint  Patient presents with  . Abscess     (Consider location/radiation/quality/duration/timing/severity/associated sxs/prior Treatment) Patient is a 29 y.o. female presenting with abscess. The history is provided by the patient.  Abscess Context: injected drug use   Associated symptoms: fever   Associated symptoms: no headaches, no nausea and no vomiting    patient with fever and right forearm abscess. She's not a drug user. Her last few days she's had redness and swelling in her forearm. States it is gotten somewhat smaller last few days. States she just had a grandfather died of sepsis. She has had fever and Chills. She has had some drainage out of the abscess. No chest pain. No trouble breathing. She's had some lightheadedness. She denies dysuria. She denies cough. She states a few weeks ago she did have some severe lower abdominal pain that has since resolved. She denies vaginal bleeding or discharge.  Past Medical History  Diagnosis Date  . Depression    Past Surgical History  Procedure Laterality Date  . Mouth surgery     Family History  Problem Relation Age of Onset  . Cancer Other    History  Substance Use Topics  . Smoking status: Current Every Day Smoker -- 1.00 packs/day for 5 years    Types: Cigarettes  . Smokeless tobacco: Never Used  . Alcohol Use: No   OB History    Gravida Para Term Preterm AB TAB SAB Ectopic Multiple Living   2 2 2       2      Review of Systems  Constitutional: Positive for fever and appetite change. Negative for activity change.  Eyes: Negative for pain.  Respiratory: Positive for shortness of breath. Negative for chest tightness.   Cardiovascular: Negative for chest pain and leg swelling.  Gastrointestinal: Negative for nausea, vomiting, abdominal pain and diarrhea.  Genitourinary: Negative for flank  pain.  Musculoskeletal: Negative for back pain and neck stiffness.  Skin: Positive for wound. Negative for rash.  Neurological: Positive for light-headedness. Negative for weakness, numbness and headaches.  Psychiatric/Behavioral: Negative for behavioral problems.      Allergies  Coconut flavor and Hydrocodone  Home Medications   Prior to Admission medications   Medication Sig Start Date End Date Taking? Authorizing Provider  ibuprofen (ADVIL,MOTRIN) 200 MG tablet Take 800 mg by mouth daily as needed for moderate pain.   Yes Historical Provider, MD  citalopram (CELEXA) 20 MG tablet Take 20 mg by mouth daily.    Historical Provider, MD  Etonogestrel (IMPLANON [place]) Inject into the skin.      Historical Provider, MD  sulfamethoxazole-trimethoprim (BACTRIM DS,SEPTRA DS) 800-160 MG per tablet Take 2 tablets by mouth 2 (two) times daily. 11/26/14   Jerald KiefStephen K Chiu, MD   BP 118/72 mmHg  Pulse 53  Temp(Src) 98.2 F (36.8 C) (Oral)  Resp 21  Ht 5\' 5"  (1.651 m)  Wt 103 lb 6.4 oz (46.901 kg)  BMI 17.21 kg/m2  SpO2 97% Physical Exam  Constitutional: She appears well-developed and well-nourished.  Eyes: EOM are normal.  Cardiovascular:  Tachycardia  Pulmonary/Chest: Effort normal.  Abdominal: There is no tenderness.  Musculoskeletal: She exhibits tenderness.  Neurological: She is alert.  Skin: Skin is warm.  Abscess over dorsum of right forearm distal ulna. Some fluctuance. Approximately 2-1/2 cm. Healing wound more proximally along  forearm. Some slight bleeding. Radial pulse intact. Normal nailbeds.    ED Course  Procedures (including critical care time) Labs Review Labs Reviewed  CBC WITH DIFFERENTIAL/PLATELET - Abnormal; Notable for the following:    WBC 13.2 (*)    RBC 3.48 (*)    Hemoglobin 9.3 (*)    HCT 29.2 (*)    RDW 17.6 (*)    Neutrophils Relative % 86 (*)    Neutro Abs 11.4 (*)    Lymphocytes Relative 7 (*)    All other components within normal limits   COMPREHENSIVE METABOLIC PANEL - Abnormal; Notable for the following:    Potassium 3.3 (*)    Glucose, Bld 114 (*)    Calcium 8.4 (*)    Albumin 3.0 (*)    AST 46 (*)    All other components within normal limits  URINE RAPID DRUG SCREEN (HOSP PERFORMED) - Abnormal; Notable for the following:    Opiates POSITIVE (*)    All other components within normal limits  CBC - Abnormal; Notable for the following:    RBC 3.14 (*)    Hemoglobin 8.5 (*)    HCT 26.9 (*)    RDW 17.9 (*)    All other components within normal limits  COMPREHENSIVE METABOLIC PANEL - Abnormal; Notable for the following:    Calcium 8.0 (*)    Total Protein 5.7 (*)    Albumin 2.3 (*)    All other components within normal limits  CBC - Abnormal; Notable for the following:    RBC 2.95 (*)    Hemoglobin 8.0 (*)    HCT 25.1 (*)    RDW 17.5 (*)    All other components within normal limits  CULTURE, BLOOD (ROUTINE X 2)  CULTURE, BLOOD (ROUTINE X 2)  URINE CULTURE  CULTURE, BLOOD (ROUTINE X 2)  CULTURE, BLOOD (ROUTINE X 2)  HEPATITIS PANEL, ACUTE  HIV ANTIBODY (ROUTINE TESTING)  URINALYSIS, ROUTINE W REFLEX MICROSCOPIC  PREGNANCY, URINE  I-STAT CG4 LACTIC ACID, ED    Imaging Review No results found.   EKG Interpretation None      MDM   Final diagnoses:  Sepsis, due to unspecified organism  Right forearm cellulitis  Uncomplicated opioid dependence     patient with forearm cellulitis. Also found to be tachycardic with fever 103. The wound on the forearm did not seem to be large enough to give her this intensive reaction. She is an IV drug user and thought was of endocarditis. Admit to internal medicine. Zosyn  And vancomycin been started and blood cultures have been sent. Repeat fluid boluses given for hypotension.  CRITICAL CARE Performed by: Billee CashingPICKERING,Margert Edsall R. Total critical care time: 30 Critical care time was exclusive of separately billable procedures and treating other patients. Critical care  was necessary to treat or prevent imminent or life-threatening deterioration. Critical care was time spent personally by me on the following activities: development of treatment plan with patient and/or surrogate as well as nursing, discussions with consultants, evaluation of patient's response to treatment, examination of patient, obtaining history from patient or surrogate, ordering and performing treatments and interventions, ordering and review of laboratory studies, ordering and review of radiographic studies, pulse oximetry and re-evaluation of patient's condition.     Benjiman CoreNathan Shaina Gullatt, MD 11/26/14 [phone]
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
## 2014-11-24 ENCOUNTER — Other Ambulatory Visit (HOSPITAL_COMMUNITY): Payer: Self-pay

## 2014-11-24 DIAGNOSIS — L0390 Cellulitis, unspecified: Secondary | ICD-10-CM

## 2014-11-24 DIAGNOSIS — D72829 Elevated white blood cell count, unspecified: Secondary | ICD-10-CM

## 2014-11-24 DIAGNOSIS — F1910 Other psychoactive substance abuse, uncomplicated: Secondary | ICD-10-CM

## 2014-11-24 DIAGNOSIS — E876 Hypokalemia: Secondary | ICD-10-CM

## 2014-11-24 LAB — CBC
HCT: 26.9 % — ABNORMAL LOW (ref 36.0–46.0)
Hemoglobin: 8.5 g/dL — ABNORMAL LOW (ref 12.0–15.0)
MCH: 27.1 pg (ref 26.0–34.0)
MCHC: 31.6 g/dL (ref 30.0–36.0)
MCV: 85.7 fL (ref 78.0–100.0)
Platelets: 241 10*3/uL (ref 150–400)
RBC: 3.14 MIL/uL — AB (ref 3.87–5.11)
RDW: 17.9 % — AB (ref 11.5–15.5)
WBC: 8.3 10*3/uL (ref 4.0–10.5)

## 2014-11-24 LAB — COMPREHENSIVE METABOLIC PANEL
ALK PHOS: 93 U/L (ref 38–126)
ALT: 19 U/L (ref 14–54)
AST: 24 U/L (ref 15–41)
Albumin: 2.3 g/dL — ABNORMAL LOW (ref 3.5–5.0)
Anion gap: 5 (ref 5–15)
BILIRUBIN TOTAL: 0.5 mg/dL (ref 0.3–1.2)
BUN: 8 mg/dL (ref 6–20)
CHLORIDE: 111 mmol/L (ref 101–111)
CO2: 27 mmol/L (ref 22–32)
Calcium: 8 mg/dL — ABNORMAL LOW (ref 8.9–10.3)
Creatinine, Ser: 0.47 mg/dL (ref 0.44–1.00)
GFR calc Af Amer: >60 mL/min (ref >60)
Glucose, Bld: 84 mg/dL (ref 70–99)
POTASSIUM: 3.7 mmol/L (ref 3.5–5.1)
SODIUM: 143 mmol/L (ref 135–145)
Total Protein: 5.7 g/dL — ABNORMAL LOW (ref 6.5–8.1)

## 2014-11-24 LAB — HEPATITIS PANEL, ACUTE
HCV AB: NEGATIVE
HEP A IGM: NONREACTIVE
Hep B C IgM: NONREACTIVE
Hepatitis B Surface Ag: NEGATIVE

## 2014-11-24 LAB — URINE CULTURE: Colony Count: 5000

## 2014-11-24 LAB — HIV ANTIBODY (ROUTINE TESTING W REFLEX): HIV Screen 4th Generation wRfx: NONREACTIVE

## 2014-11-24 MED ORDER — PIPERACILLIN-TAZOBACTAM 3.375 G IVPB
INTRAVENOUS | Status: AC
  Filled 2014-11-24: qty 50

## 2014-11-24 MED ORDER — SODIUM CHLORIDE 0.9 % IV SOLN
INTRAVENOUS | Status: AC
  Administered 2014-11-24: 07:00:00 via INTRAVENOUS

## 2014-11-24 MED ORDER — VANCOMYCIN HCL 500 MG IV SOLR
INTRAVENOUS | Status: AC
  Filled 2014-11-24: qty 500

## 2014-11-24 MED ORDER — ZOLPIDEM TARTRATE 5 MG PO TABS
5.0000 mg | ORAL_TABLET | Freq: Once | ORAL | Status: AC
  Administered 2014-11-24: 5 mg via ORAL
  Filled 2014-11-24: qty 1

## 2014-11-24 NOTE — Progress Notes (Addendum)
BP 75/45 this am. Paged Dr Sharl MaLama. Pt denies any symptoms. Continuing to monitor at this time. Waiting to hear back from md. Bed remains in lowest position and call bell is within reach.  0700 Dr Sharl Malama returned call and ordered to continue fluids at 125 mL/hour. No further orders at this time.

## 2014-11-24 NOTE — Progress Notes (Signed)
Initial Nutrition Assessment  DOCUMENTATION CODES:  Severe malnutrition in context of chronic illness, Underweight  INTERVENTION: Ensure Enlive po BID, each supplement provides 350 kcal and 20 grams of protein     NUTRITION DIAGNOSIS: Predicted suboptimal oral intake (chronic) related to IV drug abuse as evidenced by per patient/family report >20% wt loss in past year   GOAL:  Patient will meet greater than or equal to 90% of their needs   MONITOR:  PO intake, Supplement acceptance, Weight trends, Skin  REASON FOR ASSESSMENT:  Malnutrition Screening Tool    ASSESSMENT: Pt has hx of IV drug abuse. Poor po intake and severe weight loss over past year. Fever on admission.  Pt repots usual weight 140-145#. She relates weight loss to ongoing GI upset.   She eats Activia daily and recently has added Ensure 2-3 times per day due to her poor intake. She goes on to say it's very expensive and sometimes her family  can't afford to buy it for her.  Physical exam: moderate fat mass depletion upper arms, and muscle loss to quadriceps and calves. No edema noted.   Height:  Ht Readings from Last 1 Encounters:  11/23/14 5\' 5"  (1.651 m)    Weight:  Wt Readings from Last 1 Encounters:  11/24/14 103 lb 6.4 oz (46.901 kg)    Ideal Body Weight:  56.8 kg  Wt Readings from Last 10 Encounters:  11/24/14 103 lb 6.4 oz (46.901 kg)  08/08/12 140 lb (63.504 kg)  07/30/12 147 lb (66.679 kg)  07/16/12 147 lb (66.679 kg)  07/08/12 147 lb (66.679 kg)  07/01/12 145 lb (65.772 kg)  04/29/12 147 lb (66.679 kg)  04/17/12 147 lb (66.679 kg)  04/09/12 147 lb (66.679 kg)  04/01/12 147 lb (66.679 kg)    BMI:  Body mass index is 17.21 kg/(m^2). underweight  Estimated Nutritional Needs:  Kcal:  1400-1600  Protein:  60-70 gr  Fluid:  1.4-1.6 liters daily  Skin:    cellulitis to right forearm, concern for infective endocarditis  Diet Order:  Diet regular Room service appropriate?:  Yes; Fluid consistency:: Thin  EDUCATION NEEDS:   Intake/Output Summary (Last 24 hours) at 11/24/14 1535 Last data filed at 11/24/14 1231  Gross per 24 hour  Intake 2730.83 ml  Output      2 ml  Net 2728.83 ml    Last BM:  11/23/14  Royann ShiversLynn Cadden Elizondo MS,RD,CSG,LDN Office: [phone] Pager: [phone]

## 2014-11-24 NOTE — Progress Notes (Signed)
CRITICAL VALUE ALERT  Critical value received:  Blood cultures- Gram + Cocci clusters in ana bottle  Date of notification:  11/24/2014   Time of notification:  1745  Critical value read back:Yes.    Nurse who received alert:  Cyndia DiverLindsi Balian Schaller, RN  MD notified (1st page):  Elisabeth Pigeonevine  Time of first page:  18:11  MD notified (2nd page):  Time of second page:  Responding MD:    Time MD responded:

## 2014-11-24 NOTE — Care Management Note (Signed)
Case Management Note  Patient Details  Name: Brenda Keller MRN: [id number] Date of Birth: 09-27-1985  Subjective/Objective:                  Pt admitted from home with cellulitis. Pt lives with her mother and will return home at discharge. Pt is independent with ADL's.  Action/Plan: Financial counselor is aware of self pay status. [facility]  Expected Discharge Date:                  Expected Discharge Plan:  Home/Self Care  In-House Referral:  Financial Counselor  Discharge planning Services  CM Consult  Post Acute Care Choice:  NA Choice offered to:  NA  DME Arranged:    DME Agency:     HH Arranged:    HH Agency:     Status of Service:  Completed, signed off  Medicare Important Message Given:    Date Medicare IM Given:    Medicare IM give by:    Date Additional Medicare IM Given:    Additional Medicare Important Message give by:     If discussed at Long Length of Stay Meetings, dates discussed:    Additional Comments:  Cheryl FlashBlackwell, Antone Summons Crowder, RN 11/24/2014, 11:53 AM

## 2014-11-24 NOTE — Progress Notes (Signed)
UR chart review completed.  

## 2014-11-24 NOTE — Progress Notes (Signed)
Patient ID: Brenda Keller, female   DOB: 22-Mar-1986, 29 y.o.   MRN: [id number] [facility] PRO[facility]ESS NOTE  Brenda Keller BIP:[id number] DOB: [birth year] DOA: 11/23/2014 PCP: No PCP Per Patient; CM asked to help patient establish PCP while she is in the hospital.   Brief narrative:    29 year old female with a history of IV drug abuse, smoking, depression who presented to [facility] ED with concern for right forearm abscesses which at this point look more like ulceration type lesions. Patient reported that she had some swelling in the right forearm but this has also improved since the admission.  Per patient's mother at the bedside, patient does have a history of having abscesses on arms on and off. In ER, blood pressure was 75/45, HR 148, T max 103.1 F, oxygen saturation 97% on room air. Blood work revealed leukocytosis of 13.2, hemoglobin 9.3 and potassium 3.3. X-ray of the right forearm did not reveal acute findings. Chest x-ray did not show acute cardiopulmonary findings. Patient was started on empiric vancomycin and Zosyn because of concern for sepsis.  Barrier to discharge: Blood culture results are pending. She is on IV Zosyn and vancomycin for sepsis. If blood cultures are negative and is 2-D echo is unremarkable then I anticipate discharge 11/25/2014.  Assessment/Plan:    Principal problem: Sepsis secondary to right upper extremity cellulitis and skin ulceration / IV drug abuse / leukocytosis - Sepsis criteria met on the admission with fever, tachycardia, hypotension, leukocytosis. Lactic acid was within normal limits. Source of infection - right forearm skin lesions, ulcerations. These skin lesions are likely the sites of IV drug use. - Patient was started on broad-spectrum antibiotics, vancomycin and Zosyn on the admission. Follow up final blood culture results. - Because patient is IV drug abuser obvious concern is for possible endocarditis for which reason 2-D echo was ordered. Follow-up  on results. - Urine drug screen on this admission positive for opiates. Alcohol level not obtained at the time of the admission. - Patient is still hypotensive at this time but she does not require pressor support. Continue IV fluids for blood pressure support.  Active problems: IV drug abuse / smoker - Counseling cessation provided. Patient has mother at the bedside and is not willing to answer questions about her current IV drug abuse.  Hypokalemia - Likely secondary to sepsis. Supplemented. - Potassium is within normal limits  This morning.  Normocytic anemia - Hemoglobin is 8.5 this morning. Continue to monitor CBC daily. - No reports of bleeding.   DVT Prophylaxis   - Lovenox subcutaneous ordered   Code Status: Full.  Family Communication:  plan of care discussed with the patient and her mother at the bedside Disposition Plan: Home likely 11/25/2014 if blood cultures negative and if no fevers.  IV access:  Peripheral IV  Procedures and diagnostic studies:    Dg Forearm Right 11/23/2014    Negative.   Electronically Signed   By: Conchita Paris M.D.   On: 11/23/2014 18:10   Dg Chest Portable 1 View 11/23/2014   No active disease.   Electronically Signed   By: Andreas Newport M.D.   On: 11/23/2014 18:10   Medical Consultants:  None   Other Consultants:  None   IAnti-Infectives:   Vancomycin 11/23/2014 --> Zosyn 11/23/2014 -->   [name], MD  [facility] Pager [phone]  Time spent in minutes: 25 minutes  If 7PM-7AM, please contact night-coverage [email or web address] Password [email or web address] 11/24/2014, 10:23 AM   LOS: 1  day    HPI/Subjective: No acute overnight events. Patient reports feeling better since the admission however still some pain on right forearm where she has skin lesions.  Objective: Filed Vitals:   11/23/14 1930 11/23/14 2000 11/23/14 2035 11/24/14 0522  BP: $Re'86/50 92/60 89/50 'Znq$ 75/45  Pulse: 95 96 82 69  Temp:   98.2 F (36.8 C) 97.9 F (36.6  C)  TempSrc:   Oral Oral  Resp: $Remo'15 15 16 15  'yiEhD$ Height:   '5\' 5"'$  (1.651 m)   Weight:   46.902 kg (103 lb 6.4 oz) 46.901 kg (103 lb 6.4 oz)  SpO2: 99% 100% 100% 100%    Intake/Output Summary (Last 24 hours) at 11/24/14 1023 Last data filed at 11/24/14 4332  Gross per 24 hour  Intake 1832.5 ml  Output      0 ml  Net 1832.5 ml    Exam:   General:  Pt is alert, follows commands appropriately, not in acute distress  Cardiovascular: Regular rate and rhythm, S1/S2, no murmurs  Respiratory: Clear to auscultation bilaterally, no wheezing, no crackles, no rhonchi  Abdomen: Soft, non tender, non distended, bowel sounds present  Extremities: No edema, pulses DP and PT palpable bilaterally; she has 2 small ulcerations on the right forearm which seem to be healing.  Neuro: Grossly nonfocal  Data Reviewed: Basic Metabolic Panel:  Recent Labs Lab 11/23/14 1650 11/24/14 0604  NA 135 143  K 3.3* 3.7  CL 101 111  CO2 26 27  GLUCOSE 114* 84  BUN 7 8  CREATININE 0.52 0.47  CALCIUM 8.4* 8.0*   Liver Function Tests:  Recent Labs Lab 11/23/14 1650 11/24/14 0604  AST 46* 24  ALT 25 19  ALKPHOS 124 93  BILITOT 0.4 0.5  PROT 7.1 5.7*  ALBUMIN 3.0* 2.3*   No results for input(s): LIPASE, AMYLASE in the last 168 hours. No results for input(s): AMMONIA in the last 168 hours. CBC:  Recent Labs Lab 11/23/14 1650 11/24/14 0604  WBC 13.2* 8.3  NEUTROABS 11.4*  --   HGB 9.3* 8.5*  HCT 29.2* 26.9*  MCV 83.9 85.7  PLT 298 241   Cardiac Enzymes: No results for input(s): CKTOTAL, CKMB, CKMBINDEX, TROPONINI in the last 168 hours. BNP: Invalid input(s): POCBNP CBG: No results for input(s): GLUCAP in the last 168 hours.  Recent Results (from the past 240 hour(s))  Culture, blood (routine x 2)     Status: None (Preliminary result)   Collection Time: 11/23/14  5:40 PM  Result Value Ref Range Status   Specimen Description BLOOD LEFT HAND  Final   Special Requests BOTTLES DRAWN  AEROBIC AND ANAEROBIC 6CC  Final   Culture PENDING  Incomplete   Report Status PENDING  Incomplete     Scheduled Meds: . citalopram  20 mg Oral Daily  . enoxaparin (LOVENOX) injection  40 mg Subcutaneous Q24H  . feeding supplement (ENSURE ENLIVE)  237 mL Oral BID BM  . nicotine  21 mg Transdermal Daily  . piperacillin-tazobactam (ZOSYN)  IV  3.375 g Intravenous Q8H  . vancomycin  500 mg Intravenous Q12H

## 2014-11-25 ENCOUNTER — Inpatient Hospital Stay (HOSPITAL_COMMUNITY): Payer: Self-pay

## 2014-11-25 DIAGNOSIS — F17200 Nicotine dependence, unspecified, uncomplicated: Secondary | ICD-10-CM

## 2014-11-25 DIAGNOSIS — R509 Fever, unspecified: Secondary | ICD-10-CM

## 2014-11-25 DIAGNOSIS — A4101 Sepsis due to Methicillin susceptible Staphylococcus aureus: Secondary | ICD-10-CM

## 2014-11-25 DIAGNOSIS — Z720 Tobacco use: Secondary | ICD-10-CM

## 2014-11-25 DIAGNOSIS — F1120 Opioid dependence, uncomplicated: Secondary | ICD-10-CM

## 2014-11-25 DIAGNOSIS — D649 Anemia, unspecified: Secondary | ICD-10-CM

## 2014-11-25 MED ORDER — METHOCARBAMOL 500 MG PO TABS: 500.0000 mg | ORAL_TABLET | Freq: Three times a day (TID) | ORAL | Status: DC | PRN

## 2014-11-25 MED ORDER — NAPROXEN 250 MG PO TABS: 500.0000 mg | ORAL_TABLET | Freq: Two times a day (BID) | ORAL | Status: DC | PRN

## 2014-11-25 MED ORDER — DICYCLOMINE HCL 10 MG PO CAPS: 20.0000 mg | ORAL_CAPSULE | Freq: Four times a day (QID) | ORAL | Status: DC | PRN

## 2014-11-25 MED ORDER — CLONIDINE HCL 0.1 MG PO TABS: 0.1000 mg | ORAL_TABLET | Freq: Every day | ORAL | Status: DC

## 2014-11-25 MED ORDER — CLONIDINE HCL 0.1 MG PO TABS: 0.1000 mg | ORAL_TABLET | ORAL | Status: DC

## 2014-11-25 MED ORDER — HYDROXYZINE HCL 25 MG PO TABS: 25.0000 mg | ORAL_TABLET | Freq: Four times a day (QID) | ORAL | Status: DC | PRN

## 2014-11-25 MED ORDER — CLONIDINE HCL 0.1 MG PO TABS
0.1000 mg | ORAL_TABLET | Freq: Four times a day (QID) | ORAL | Status: DC
  Administered 2014-11-25 (×2): 0.1 mg via ORAL
  Filled 2014-11-25 (×4): qty 1

## 2014-11-25 MED ORDER — TRAZODONE HCL 50 MG PO TABS
100.0000 mg | ORAL_TABLET | Freq: Every evening | ORAL | Status: DC | PRN
  Administered 2014-11-25: 100 mg via ORAL
  Filled 2014-11-25: qty 2

## 2014-11-25 MED ORDER — LOPERAMIDE HCL 2 MG PO CAPS: 2.0000 mg | ORAL_CAPSULE | ORAL | Status: DC | PRN

## 2014-11-25 NOTE — Clinical Social Work Note (Signed)
Clinical Social Work Assessment  Patient Details  Name: Brenda Keller MRN: [id number] Date of Birth: 17-Sep-1985  Date of referral:  11/25/14               Reason for consult:  Substance Use/ETOH Abuse                Permission sought to share information with:    Permission granted to share information::     Name::        Agency::     Relationship::     Contact Information:     Housing/Transportation Living arrangements for the past 2 months:  Single Family Home Source of Information:  Patient Patient Interpreter Needed:  None Criminal Activity/Legal Involvement Pertinent to Current Situation/Hospitalization:  No - Comment as needed Significant Relationships:  Parents, Dependent Children Lives with:  Parents, Minor Children Do you feel safe going back to the place where you live?  Yes Need for family participation in patient care:  Yes (Comment) (Patient will continue to need support from family. )  Care giving concerns:  Patient provides self care, there are no care giving concerns.   Social Worker assessment / plan:  CSW met with patient who was alert and oriented.  Patient identified that she lives with her mother and is currently unemployed.  Patient indicated that she has two children ages 37 and 65, however her mother has custody of her children.  Patient indicated that she has been using pain medication via needle injection for the past two years.  Patient indicated that she has previously been to substance abuse treatment in [place] and at [facility].  She indicated that she has been going to "meetings and stuff." Patient indicated that she had previously been abstinent from substances until her grandfather recently died.  She indicated that her grandfather's death triggered her relapse (she indicated that her grandfather's funeral was this past Saturday).  CSW provided patient with a [place] and a fact sheet on Opoid abuse. Patient indicated that she did not  need assistance with arranging ongoing substance abuse treatment as she knew resources and could schedule appointments for herself.  Patient indicated that her current infection has scared her as her grandfather died of "septic shock".  Patient indicated that doctor's had spoken to her about the need for ongoing antibiotic use post discharge.  She indicated that if she had to go a facility for continued antibiotics, she would go but was concerned about how she would pay for it.   Employment status:  Unemployed Forensic scientist:  Self Pay (Medicaid Pending) PT Recommendations:  Not assessed at this time Information / Referral to community resources:  Residential Substance Abuse Treatment Options, Outpatient Substance Abuse Treatment Options  Patient/Family's Response to care:  Patient understands the need for her to get involved with substance abuse treatment.  She also understands that she will be discharged on antibiotics to continue treating her infection.  Patient/Family's Understanding of and Emotional Response to Diagnosis, Current Treatment, and Prognosis:  Patient understands her diagnosis, treatment and prognosis.  Patient indicates that she will follow any discharge treatment instructions given as she is aware that her infections is of a serious nature.   Emotional Assessment Appearance:  Developmentally appropriate Attitude/Demeanor/Rapport:   (Cooperative and pleasant.) Affect (typically observed):  Accepting, Calm Orientation:  Oriented to Self, Oriented to Place, Oriented to  Time, Oriented to Situation Alcohol / Substance use:  Illicit Drugs Psych involvement (Current and /or in the  community):  No (Comment)  Discharge Needs  Concerns to be addressed:  Substance Abuse Concerns, Medication Concerns Readmission within the last 30 days:  No Current discharge risk:  Substance Abuse, Inadequate Financial Supports Barriers to Discharge:  No Barriers Identified   Ihor Gully, LCSW 11/25/2014, 11:54 AM [phone]

## 2014-11-25 NOTE — Progress Notes (Signed)
ANTIBIOTIC CONSULT NOTE - follow up  Pharmacy Consult for Vancomycin & Zosyn Indication: sepsis, bacteremia  Allergies  Allergen Reactions  . Coconut Flavor Itching and Other (See Comments)    Water blisters  . Hydrocodone Nausea And Vomiting   Patient Measurements: Height: 5\' 5"ldUCNFPLlQ$  (165.1 cm) Weight: 103 lb 6.4 oz (46.901 kg) IBW/kg (Calculated) : 57  Vital Signs: Temp: 98.7 F (37.1 C) (05/11 0430) Temp Source: Oral (05/11 0430) BP: 97/59 mmHg (05/11 0430) Pulse Rate: 70 (05/11 0430) Intake/Output from previous day: 05/10 0701 - 05/11 0700 In: 1738.3 [P.O.:1080; I.V.:608.3; IV Piggyback:50] Out: 2 [Urine:2] Intake/Output from this shift:    Labs:  Recent Labs  11/23/14 1650 11/24/14 0604  WBC 13.2* 8.3  HGB 9.3* 8.5*  PLT 298 241  CREATININE 0.52 0.47   Estimated Creatinine Clearance: 77.5 mL/min (by C-G formula based on Cr of 0.47). No results for input(s): VANCOTROUGH, VANCOPEAK, VANCORANDOM, GENTTROUGH, GENTPEAK, GENTRANDOM, TOBRATROUGH, TOBRAPEAK, TOBRARND, AMIKACINPEAK, AMIKACINTROU, AMIKACIN in the last 72 hours.   Microbiology: Recent Results (from the past 720 hour(s))  Culture, blood (routine x 2)     Status: None (Preliminary result)   Collection Time: 11/23/14  4:50 PM  Result Value Ref Range Status   Specimen Description BLOOD LEFT ARM DRAWN BY RN  Final   Special Requests BOTTLES DRAWN AEROBIC AND ANAEROBIC 5CC  Final   Culture   Final    GRAM POSITIVE COCCI IN CLUSTERS Gram Stain Report Called to,Read Back By and Verified With: FORTE,L. AT 1750 ON 11/24/2014 BY BAUGHAM,M. Performed at [facility]    Report Status PENDING  Incomplete  Urine culture     Status: None   Collection Time: 11/23/14  5:20 PM  Result Value Ref Range Status   Specimen Description URINE, CLEAN CATCH  Final   Special Requests NONE  Final   Colony Count   Final    5,000 COLONIES/ML Performed at [facility]    Culture   Final    INSIGNIFICANT  GROWTH Performed at [facility]    Report Status 11/24/2014 FINAL  Final  Culture, blood (routine x 2)     Status: None (Preliminary result)   Collection Time: 11/23/14  5:40 PM  Result Value Ref Range Status   Specimen Description BLOOD LEFT HAND  Final   Special Requests BOTTLES DRAWN AEROBIC AND ANAEROBIC 6CC  Final   Culture   Final    GRAM POSITIVE COCCI IN CLUSTERS Gram Stain Report Called to,Read Back By and Verified With: BULLOCK T AT 0103 ON [id number] BY FORSYTH K    Report Status PENDING  Incomplete   Medical History: History reviewed. No pertinent past medical history.  Medications:  Prescriptions prior to admission  Medication Sig Dispense Refill Last Dose  . ibuprofen (ADVIL,MOTRIN) 200 MG tablet Take 800 mg by mouth daily as needed for moderate pain.   11/22/2014 at Unknown time  . citalopram (CELEXA) 20 MG tablet Take 20 mg by mouth daily.   06/25/2013 at Unknown time  . Etonogestrel (IMPLANON [place]) Inject into the skin.     injected   Anti-infectives    Start     Dose/Rate Route Frequency Ordered Stop   11/24/14 0600  vancomycin (VANCOCIN) 500 mg in sodium chloride 0.9 % 100 mL IVPB     500 mg 100 mL/hr over 60 Minutes Intravenous Every 12 hours 11/23/14 2108     11/24/14 0100  piperacillin-tazobactam (ZOSYN) IVPB 3.375 g     3.375  g 12.5 mL/hr over 240 Minutes Intravenous Every 8 hours 11/23/14 2108     11/23/14 1715  piperacillin-tazobactam (ZOSYN) IVPB 3.375 g     3.375 g 100 mL/hr over 30 Minutes Intravenous  Once 11/23/14 1708 11/23/14 1934   11/23/14 1715  vancomycin (VANCOCIN) IVPB 1000 mg/200 mL premix     1,000 mg 200 mL/hr over 60 Minutes Intravenous  Once 11/23/14 1708 11/23/14 2007     Assessment: 29 y/o female history of IV drug abuse, c/o fever and right forearm abscess. Concern for possible infective endocarditis due to drug abuse.  Blood cx positive for GPC.  Initial doses of ABX given in ED on admission.  Vancomycin 5/10 >> Zosyn 5/10  >>  Goal of Therapy:  Vancomycin trough level 15-20 mcg/ml  Plan:  Zosyn 3.375 GM IV every 8 hours, infuse each dose over 4 hours Vancomycin 500 mg IV every 12 hours Vancomycin trough at steady state F/U cultures and sensitivities (would favor ID consult) Monitor labs and renal fxn.  Brenda Keller, Brenda Keller 11/25/2014,8:36 AM

## 2014-11-25 NOTE — Progress Notes (Signed)
[facility] PROGRESS NOTE  Brenda CalicoJeana N Keller VHQ:[id number] DOB: [birth year] DOA: 11/23/2014 PCP: No PCP Per Patient  Assessment/Plan: 1. Sepsis with GM pos bacteremia 1. Presenting with fevers, tachycardia, leukocytosis 2. Speciation pending 3. 2d echo without vegetations seen 4. Currently on vancomycin and zosyn. Will narrow to vancomycin alone 5. Leukocytosis is improving 6. Given risk of endocarditis, have discussed with Cardiology for consideration for TEE 7. Pending results, would discuss with ID regarding abx choices. Given active IV drug abuse history, would AVOID PICC AT ALL POSSIBLE 2. IV Drug abuse 1. Per above 2. Track marks on exam 3. Pt admitted to IV drugs just prior to admission 3. Tobacco abuse 1. Cessation was done 4. Normocytic anemia 1. Stable hgb 5. DVT prophylaxis 1. Heparin subQ  Code Status: Full Family Communication: Pt in room, family at bedside (indicate person spoken with, relationship, and if by phone, the number) Disposition Plan: Pending   Consultants:  Cardiology  Procedures:    Antibiotics:  Vancyomycin 5/9>>>  Zosyn 5/9>>>5/11  HPI/Subjective: Feels better today. Pt states she is eager to go home  Objective: Filed Vitals:   11/24/14 2119 11/25/14 0430 11/25/14 1104 11/25/14 1349  BP: 100/54 97/59 95/57  89/52  Pulse: 66 70  70  Temp: 98.2 F (36.8 C) 98.7 F (37.1 C)  97.7 F (36.5 C)  TempSrc: Oral Oral  Oral  Resp: 16 18    Height:      Weight:      SpO2: 100% 100%  99%    Intake/Output Summary (Last 24 hours) at 11/25/14 1445 Last data filed at 11/25/14 1058  Gross per 24 hour  Intake   1070 ml  Output      0 ml  Net   1070 ml   Filed Weights   11/23/14 1619 11/23/14 2035 11/24/14 0522  Weight: 45.36 kg (100 lb) 46.902 kg (103 lb 6.4 oz) 46.901 kg (103 lb 6.4 oz)    Exam:   General:  Awake, in nad  Cardiovascular: regular, s1, s2  Respiratory: normal resp effort, no wheezing  Abdomen:  soft,nondistended  Musculoskeletal: perfused, no clubbing   Data Reviewed: Basic Metabolic Panel:  Recent Labs Lab 11/23/14 1650 11/24/14 0604  NA 135 143  K 3.3* 3.7  CL 101 111  CO2 26 27  GLUCOSE 114* 84  BUN 7 8  CREATININE 0.52 0.47  CALCIUM 8.4* 8.0*   Liver Function Tests:  Recent Labs Lab 11/23/14 1650 11/24/14 0604  AST 46* 24  ALT 25 19  ALKPHOS 124 93  BILITOT 0.4 0.5  PROT 7.1 5.7*  ALBUMIN 3.0* 2.3*   No results for input(s): LIPASE, AMYLASE in the last 168 hours. No results for input(s): AMMONIA in the last 168 hours. CBC:  Recent Labs Lab 11/23/14 1650 11/24/14 0604  WBC 13.2* 8.3  NEUTROABS 11.4*  --   HGB 9.3* 8.5*  HCT 29.2* 26.9*  MCV 83.9 85.7  PLT 298 241   Cardiac Enzymes: No results for input(s): CKTOTAL, CKMB, CKMBINDEX, TROPONINI in the last 168 hours. BNP (last 3 results) No results for input(s): BNP in the last 8760 hours.  ProBNP (last 3 results) No results for input(s): PROBNP in the last 8760 hours.  CBG: No results for input(s): GLUCAP in the last 168 hours.  Recent Results (from the past 240 hour(s))  Culture, blood (routine x 2)     Status: None (Preliminary result)   Collection Time: 11/23/14  4:50 PM  Result Value Ref Range Status  Specimen Description BLOOD LEFT ARM DRAWN BY RN  Final   Special Requests BOTTLES DRAWN AEROBIC AND ANAEROBIC 5CC  Final   Culture   Final    GRAM POSITIVE COCCI IN CLUSTERS Note: Gram Stain Report Called to,Read Back By and Verified With: FORTE L RN 17:50 11/24/14 BY Ginette PitmanBAUGHAM M Performed at [facility] Performed at [facility]    Report Status PENDING  Incomplete  Urine culture     Status: None   Collection Time: 11/23/14  5:20 PM  Result Value Ref Range Status   Specimen Description URINE, CLEAN CATCH  Final   Special Requests NONE  Final   Colony Count   Final    5,000 COLONIES/ML Performed at [facility]    Culture   Final    INSIGNIFICANT  GROWTH Performed at [facility]    Report Status 11/24/2014 FINAL  Final  Culture, blood (routine x 2)     Status: None (Preliminary result)   Collection Time: 11/23/14  5:40 PM  Result Value Ref Range Status   Specimen Description BLOOD LEFT HAND  Final   Special Requests BOTTLES DRAWN AEROBIC AND ANAEROBIC 6CC  Final   Culture   Final    GRAM POSITIVE COCCI IN CLUSTERS Gram Stain Report Called to,Read Back By and Verified With: BULLOCK T AT 0103 ON [id number] BY FORSYTH K Performed at [facility]    Report Status PENDING  Incomplete     Studies: Dg Forearm Right  11/23/2014   CLINICAL DATA:  Soft tissue lesion of the right forearm, IV drug abuse  EXAM: RIGHT FOREARM - 2 VIEW  COMPARISON:  None.  FINDINGS: There is no evidence of fracture or other focal bone lesions. Soft tissues are unremarkable. IV catheter and tubing project over the distal right forearm.  IMPRESSION: Negative.   Electronically Signed   By: Christiana PellantGretchen  Green M.D.   On: 11/23/2014 18:10   Dg Chest Portable 1 View  11/23/2014   CLINICAL DATA:  Right forearm pustules.  IV drug user.  EXAM: PORTABLE CHEST - 1 VIEW  COMPARISON:  None.  FINDINGS: A single AP portable view of the chest demonstrates no focal airspace consolidation or alveolar edema. The lungs are grossly clear. There is no large effusion or pneumothorax. Cardiac and mediastinal contours appear unremarkable.  IMPRESSION: No active disease.   Electronically Signed   By: Ellery Plunkaniel R Mitchell M.D.   On: 11/23/2014 18:10    Scheduled Meds: . citalopram  20 mg Oral Daily  . cloNIDine  0.1 mg Oral QID   Followed by  . [START ON 11/27/2014] cloNIDine  0.1 mg Oral BH-qamhs   Followed by  . [START ON 11/29/2014] cloNIDine  0.1 mg Oral QAC breakfast  . feeding supplement (ENSURE ENLIVE)  237 mL Oral BID BM  . nicotine  21 mg Transdermal Daily  . piperacillin-tazobactam (ZOSYN)  IV  3.375 g Intravenous Q8H  . vancomycin  500 mg Intravenous Q12H   Continuous  Infusions:   Active Problems:   Opiate dependence   Sepsis   Fever    Merel Santoli K  [facility] Pager [phone]. If 7PM-7AM, please contact night-coverage at [email or web address], password [email or web address] 11/25/2014, 2:45 PM  LOS: 2 days

## 2014-11-26 ENCOUNTER — Encounter (HOSPITAL_COMMUNITY): Payer: Self-pay | Admitting: Anesthesiology

## 2014-11-26 ENCOUNTER — Inpatient Hospital Stay (HOSPITAL_COMMUNITY): Payer: Self-pay | Admitting: Anesthesiology

## 2014-11-26 ENCOUNTER — Inpatient Hospital Stay (HOSPITAL_COMMUNITY): Payer: MEDICAID

## 2014-11-26 ENCOUNTER — Encounter (HOSPITAL_COMMUNITY)
Admission: EM | Disposition: A | Discharge status: Home / Self Care | Payer: Self-pay | Source: Home / Self Care | Attending: Internal Medicine

## 2014-11-26 DIAGNOSIS — R7881 Bacteremia: Secondary | ICD-10-CM

## 2014-11-26 DIAGNOSIS — B958 Unspecified staphylococcus as the cause of diseases classified elsewhere: Secondary | ICD-10-CM

## 2014-11-26 DIAGNOSIS — B9562 Methicillin resistant Staphylococcus aureus infection as the cause of diseases classified elsewhere: Secondary | ICD-10-CM

## 2014-11-26 DIAGNOSIS — I340 Nonrheumatic mitral (valve) insufficiency: Secondary | ICD-10-CM

## 2014-11-26 DIAGNOSIS — E43 Unspecified severe protein-calorie malnutrition: Secondary | ICD-10-CM

## 2014-11-26 DIAGNOSIS — I330 Acute and subacute infective endocarditis: Secondary | ICD-10-CM

## 2014-11-26 HISTORY — PX: TEE WITHOUT CARDIOVERSION: SHX5443

## 2014-11-26 LAB — CBC
HEMATOCRIT: 25.1 % — AB (ref 36.0–46.0)
Hemoglobin: 8 g/dL — ABNORMAL LOW (ref 12.0–15.0)
MCH: 27.1 pg (ref 26.0–34.0)
MCHC: 31.9 g/dL (ref 30.0–36.0)
MCV: 85.1 fL (ref 78.0–100.0)
PLATELETS: 202 10*3/uL (ref 150–400)
RBC: 2.95 MIL/uL — AB (ref 3.87–5.11)
RDW: 17.5 % — ABNORMAL HIGH (ref 11.5–15.5)
WBC: 6.9 10*3/uL (ref 4.0–10.5)

## 2014-11-26 SURGERY — ECHOCARDIOGRAM, TRANSESOPHAGEAL
Anesthesia: Monitor Anesthesia Care

## 2014-11-26 MED ORDER — CHLORHEXIDINE GLUCONATE CLOTH 2 % EX PADS: 6.0000 | MEDICATED_PAD | Freq: Every day | CUTANEOUS | Status: DC

## 2014-11-26 MED ORDER — FENTANYL CITRATE (PF) 100 MCG/2ML IJ SOLN
INTRAMUSCULAR | Status: DC | PRN
  Administered 2014-11-26: 50 ug via INTRAVENOUS

## 2014-11-26 MED ORDER — PROPOFOL INFUSION 10 MG/ML OPTIME
INTRAVENOUS | Status: DC | PRN
  Administered 2014-11-26: 125 ug/kg/min via INTRAVENOUS

## 2014-11-26 MED ORDER — MIDAZOLAM HCL 2 MG/2ML IJ SOLN
INTRAMUSCULAR | Status: AC
  Filled 2014-11-26: qty 2

## 2014-11-26 MED ORDER — MIDAZOLAM HCL 5 MG/5ML IJ SOLN
INTRAMUSCULAR | Status: DC | PRN
  Administered 2014-11-26: 2 mg via INTRAVENOUS

## 2014-11-26 MED ORDER — LIDOCAINE VISCOUS 2 % MT SOLN
OROMUCOSAL | Status: AC
  Filled 2014-11-26: qty 15

## 2014-11-26 MED ORDER — FENTANYL CITRATE (PF) 100 MCG/2ML IJ SOLN: 25.0000 ug | INTRAMUSCULAR | Status: DC | PRN

## 2014-11-26 MED ORDER — SODIUM CHLORIDE 0.9 % IJ SOLN
INTRAMUSCULAR | Status: AC
  Filled 2014-11-26: qty 10

## 2014-11-26 MED ORDER — SULFAMETHOXAZOLE-TRIMETHOPRIM 800-160 MG PO TABS: 2.0000 | ORAL_TABLET | Freq: Two times a day (BID) | ORAL | Status: DC

## 2014-11-26 MED ORDER — LACTATED RINGERS IV SOLN
INTRAVENOUS | Status: DC | PRN
  Administered 2014-11-26: 10:00:00 via INTRAVENOUS

## 2014-11-26 MED ORDER — FENTANYL CITRATE (PF) 100 MCG/2ML IJ SOLN
25.0000 ug | INTRAMUSCULAR | Status: AC
  Administered 2014-11-26 (×2): 25 ug via INTRAVENOUS
  Filled 2014-11-26: qty 2

## 2014-11-26 MED ORDER — LACTATED RINGERS IV SOLN
INTRAVENOUS | Status: DC
  Administered 2014-11-26: 10:00:00 via INTRAVENOUS

## 2014-11-26 MED ORDER — LIDOCAINE HCL (PF) 1 % IJ SOLN
INTRAMUSCULAR | Status: AC
  Filled 2014-11-26: qty 5

## 2014-11-26 MED ORDER — ONDANSETRON HCL 4 MG/2ML IJ SOLN: 4.0000 mg | Freq: Once | INTRAMUSCULAR | Status: DC | PRN

## 2014-11-26 MED ORDER — LIDOCAINE HCL (CARDIAC) 10 MG/ML IV SOLN
INTRAVENOUS | Status: DC | PRN
  Administered 2014-11-26: 50 mg via INTRAVENOUS

## 2014-11-26 MED ORDER — PROPOFOL 10 MG/ML IV BOLUS
INTRAVENOUS | Status: AC
  Filled 2014-11-26: qty 20

## 2014-11-26 MED ORDER — MUPIROCIN 2 % EX OINT: 1.0000 "application " | TOPICAL_OINTMENT | Freq: Two times a day (BID) | CUTANEOUS | Status: DC

## 2014-11-26 MED ORDER — FENTANYL CITRATE (PF) 100 MCG/2ML IJ SOLN
INTRAMUSCULAR | Status: AC
  Filled 2014-11-26: qty 2

## 2014-11-26 NOTE — Discharge Summary (Addendum)
Physician Discharge Summary  Brenda Keller AOZ:[id number] DOB: 06/20/1986 DOA: 11/23/2014  PCP: No PCP Per Patient  Admit date: 11/23/2014 Discharge date: 11/26/2014  Time spent: 20 minutes  Recommendations for Outpatient Follow-up:  1. Follow up with PCP as scheduled 2. 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 Briefly, pt presents as a 29yo with active IV drug abuse who presented with increased redness and swelling over the forearm with active drainage. The patient was noted to be septic on admission. The patient was admitted for further work up.  Hospital Course:  1. Sepsis with MRSA bacteremia with endocarditis 1. Presenting with fevers, tachycardia, leukocytosis 2. During this course, sepsis resolved 3. 2d echo without vegetations seen but TEE with vegetations involving mitral and tricuspid valves 4. Patient had been initially continued on vanc with zosyn and later vanc alone 5. Given recent and active IV drug abuse history, would AVOID PICC AT ALL POSSIBLE 6. Have discussed the case with on-call Infectious Disease specialist who recommends AT LEAST 6-12 weeks of Bactrim DS, 2 tabs po bid and for very close follow up with ID in 2 weeks 2. IV Drug abuse 1. Per above 2. Fresh track marks on exam 3. Pt admitted to IV drugs just prior to admission 3. Tobacco abuse 1. Cessation was done 4. Normocytic anemia 1. Stable hgb 5. DVT  prophylaxis 1. Heparin subQ while inpatient  Procedures: 2d echo - no vegetations TEE - vegetations involving mitral and tricuspid valves  Consultations:  Cardiology  ID over phone  Discharge Exam: Filed Vitals:   11/26/14 1115 11/26/14 1125 11/26/14 1130 11/26/14 1140  BP: 106/60 118/72    Pulse: 53 53 56 53  Temp:      TempSrc:      Resp: 35 23 15 21   Height:      Weight:      SpO2: 100% 99% 99% 97%    General: Awake, in nad Cardiovascular: regular, s1, s2 Respiratory: normal resp effort, no wheezing  Discharge Instructions     Medication List    TAKE these medications        citalopram 20 MG tablet  Commonly known as:  CELEXA  Take 20 mg by mouth daily.     ibuprofen 200 MG tablet  Commonly known as:  ADVIL,MOTRIN  Take 800 mg by mouth daily as needed for moderate pain.     IMPLANON [place]  Inject into the skin.     sulfamethoxazole-trimethoprim 800-160 MG per tablet  Commonly known as:  BACTRIM DS,SEPTRA DS  Take 2 tablets by mouth 2 (two) times daily.       Allergies  Allergen Reactions  . Coconut Flavor Itching and Other (See Comments)    Water blisters  . Hydrocodone Nausea And Vomiting   Follow-up Information    [facility]   Why:  at 1:30   Contact information:  [address] [place] [address] [phone]       [facility]              In 2 weeks.   Why:  Hospital follow up - [phone]   Contact information:   [address] [place] [address]        The results of significant diagnostics from this hospitalization (including imaging, microbiology, ancillary and laboratory) are listed below for reference.    Significant Diagnostic Studies: Dg Forearm Right  11/23/2014   CLINICAL DATA:  Soft tissue lesion of the right forearm, IV drug abuse  EXAM: RIGHT FOREARM - 2 VIEW  COMPARISON:  None.  FINDINGS:  There is no evidence of fracture or other focal bone lesions. Soft tissues are unremarkable. IV catheter and tubing project over the distal right forearm.  IMPRESSION: Negative.   Electronically Signed   By: Christiana Pellant M.D.   On: 11/23/2014 18:10   Dg Chest Portable 1 View  11/23/2014   CLINICAL DATA:  Right forearm pustules.  IV drug user.  EXAM: PORTABLE CHEST - 1 VIEW  COMPARISON:  None.  FINDINGS: A single AP portable view of the chest demonstrates no focal airspace consolidation or alveolar edema. The lungs are grossly clear. There is no large effusion or pneumothorax. Cardiac and mediastinal contours appear unremarkable.  IMPRESSION: No active disease.   Electronically Signed   By: Ellery Plunk M.D.   On: 11/23/2014 18:10    Microbiology: Recent Results (from the past 240 hour(s))  Culture, blood (routine x 2)     Status: None (Preliminary result)   Collection Time: 11/23/14  4:50 PM  Result Value Ref Range Status   Specimen Description BLOOD LEFT ARM DRAWN BY RN  Final   Special Requests BOTTLES DRAWN AEROBIC AND ANAEROBIC 5CC  Final   Culture   Final    STAPHYLOCOCCUS AUREUS Note: Gram Stain Report Called to,Read Back By and Verified With: FORTE L RN 17:50 11/24/14 BY Ginette Pitman Performed at [facility] Performed at [facility]    Report Status PENDING  Incomplete  Urine culture     Status: None   Collection Time: 11/23/14  5:20 PM  Result Value Ref Range Status   Specimen Description URINE, CLEAN CATCH  Final   Special Requests NONE  Final   Colony Count   Final    5,000 COLONIES/ML Performed at [facility]   Final    INSIGNIFICANT GROWTH Performed at [facility]    Report Status 11/24/2014 FINAL  Final  Culture, blood (routine x 2)     Status: None (Preliminary result)   Collection Time: 11/23/14  5:40 PM  Result Value Ref Range Status   Specimen Description BLOOD LEFT HAND  Final   Special Requests BOTTLES DRAWN  AEROBIC AND ANAEROBIC 6CC  Final   Culture   Final    METHICILLIN RESISTANT STAPHYLOCOCCUS AUREUS Note: RIFAMPIN AND GENTAMICIN SHOULD NOT BE USED AS SINGLE DRUGS FOR TREATMENT OF STAPH INFECTIONS. CRITICAL RESULT CALLED TO, READ BACK BY AND VERIFIED WITH: JENNIFER SMITH 11/26/14 1125 BY SMITHERSJ Note: Gram Stain Report Called to,Read Back By and Verified With: BULLOCK T 0103 11/25/14 BY FORSYTH K Performed at [facility] Performed at [facility]    Report Status PENDING  Incomplete  Culture, blood (routine x 2)     Status: None (Preliminary result)  Collection Time: 11/26/14  8:44 AM  Result Value Ref Range Status   Specimen Description BLOOD LEFT HAND  Final   Special Requests   Final    BOTTLES DRAWN AEROBIC AND ANAEROBIC AEB=11CC ANA=6CC   Culture NO GROWTH <24 HRS  Final   Report Status PENDING  Incomplete  Culture, blood (routine x 2)     Status: None (Preliminary result)   Collection Time: 11/26/14  8:57 AM  Result Value Ref Range Status   Specimen Description BLOOD RIGHT HAND  Final   Special Requests BOTTLES DRAWN AEROBIC AND ANAEROBIC 5CC  Final   Culture NO GROWTH <24 HRS  Final   Report Status PENDING  Incomplete     Labs: Basic Metabolic Panel:  Recent Labs Lab 11/23/14 1650 11/24/14 0604  NA 135 143  K 3.3* 3.7  CL 101 111  CO2 26 27  GLUCOSE 114* 84  BUN 7 8  CREATININE 0.52 0.47  CALCIUM 8.4* 8.0*   Liver Function Tests:  Recent Labs Lab 11/23/14 1650 11/24/14 0604  AST 46* 24  ALT 25 19  ALKPHOS 124 93  BILITOT 0.4 0.5  PROT 7.1 5.7*  ALBUMIN 3.0* 2.3*   No results for input(s): LIPASE, AMYLASE in the last 168 hours. No results for input(s): AMMONIA in the last 168 hours. CBC:  Recent Labs Lab 11/23/14 1650 11/24/14 0604 11/26/14 0607  WBC 13.2* 8.3 6.9  NEUTROABS 11.4*  --   --   HGB 9.3* 8.5* 8.0*  HCT 29.2* 26.9* 25.1*  MCV 83.9 85.7 85.1  PLT 298 241 202   Cardiac Enzymes: No results for input(s): CKTOTAL,  CKMB, CKMBINDEX, TROPONINI in the last 168 hours. BNP: BNP (last 3 results) No results for input(s): BNP in the last 8760 hours.  ProBNP (last 3 results) No results for input(s): PROBNP in the last 8760 hours.  CBG: No results for input(s): GLUCAP in the last 168 hours.   Signed:  CHIU, STEPHEN K  [facility] 11/26/2014, 1:28 PM

## 2014-11-26 NOTE — Transfer of Care (Signed)
Immediate Anesthesia Transfer of Care Note  Patient: Brenda Keller  Procedure(s) Performed: Procedure(s): TRANSESOPHAGEAL ECHOCARDIOGRAM (TEE) WITH PROPOFOL (N/A)  Patient Location: PACU  Anesthesia Type:MAC  Level of Consciousness: awake, oriented and patient cooperative  Airway & Oxygen Therapy: Patient Spontanous Breathing and Patient connected to face mask oxygen  Post-op Assessment: Report given to RN and Post -op Vital signs reviewed and stable  Post vital signs: Reviewed and stable  Last Vitals:  Filed Vitals:   11/26/14 1030  BP: 114/79  Pulse:   Temp:   Resp: 19    Complications: No apparent anesthesia complications

## 2014-11-26 NOTE — Procedures (Signed)
Preliminary TEE report: Normal LV systolic function. Mild to moderate mitral and tricuspid regurgitation. Anterior mitral leaflet vegetation seen and probable posterior tricuspid leaflet vegetation. Full report to follow.

## 2014-11-26 NOTE — Anesthesia Procedure Notes (Signed)
Procedure Name: MAC Date/Time: 11/26/2014 10:37 AM Performed by: Pernell DupreADAMS, Johm Pfannenstiel A Pre-anesthesia Checklist: Patient identified, Emergency Drugs available, Suction available, Patient being monitored and Timeout performed Oxygen Delivery Method: Simple face mask

## 2014-11-26 NOTE — Progress Notes (Signed)
Discharge instructions and medications reviewed, verbalized understanding. Ambulated to lobby independently, escorted by staff in stable condition.

## 2014-11-26 NOTE — Progress Notes (Signed)
CRITICAL VALUE ALERT  Critical value received:  MRSA  Date of notification:  11/26/14  Time of notification:  1123  Critical value read back: yes  Nurse who received alert:  Roney MansJennifer Antawn Sison RN  MD notified (1st page):  Rickey BarbaraStephen Chiu MD  Time of first page:  1132  MD notified (2nd page):  Time of second page:  Responding MD:    Time MD responded:

## 2014-11-26 NOTE — Anesthesia Preprocedure Evaluation (Addendum)
Anesthesia Evaluation  Patient identified by MRN, date of birth, ID band Patient awake    Reviewed: Allergy & Precautions, NPO status , Patient's Chart, lab work & pertinent test results  Airway Mallampati: II  TM Distance: >3 FB Neck ROM: Full  Mouth opening: Limited Mouth Opening  Dental  (+) Teeth Intact   Pulmonary Current Smoker,  breath sounds clear to auscultation        Cardiovascular Rhythm:Regular Rate:Normal  R/O endocarditis   Neuro/Psych PSYCHIATRIC DISORDERS Depression    GI/Hepatic negative GI ROS, (+)     Substance abuse:  Staphylococcus aureus bacteremia with sepsis.  IV drug use,   Endo/Other    Renal/GU      Musculoskeletal   Abdominal   Peds  Hematology   Anesthesia Other Findings Staphylococcus aureus bacteremia with sepsis  Opiate dependence  Reproductive/Obstetrics                            Anesthesia Physical Anesthesia Plan  ASA: III  Anesthesia Plan: MAC   Post-op Pain Management:    Induction:   Airway Management Planned: Simple Face Mask  Additional Equipment:   Intra-op Plan:   Post-operative Plan:   Informed Consent: I have reviewed the patients History and Physical, chart, labs and discussed the procedure including the risks, benefits and alternatives for the proposed anesthesia with the patient or authorized representative who has indicated his/her understanding and acceptance.     Plan Discussed with:   Anesthesia Plan Comments:         Anesthesia Quick Evaluation

## 2014-11-26 NOTE — Care Management Note (Signed)
Case Management Note  Patient Details  Name: Brenda Keller MRN: [id number] Date of Birth: 1985/12/01  Subjective/Objective:                    Action/Plan:   Expected Discharge Date:                  Expected Discharge Plan:  Home/Self Care  In-House Referral:  Financial Counselor  Discharge planning Services  CM Consult  Post Acute Care Choice:  NA Choice offered to:  NA  DME Arranged:    DME Agency:     HH Arranged:    HH Agency:     Status of Service:  Completed, signed off  Medicare Important Message Given:    Date Medicare IM Given:    Medicare IM give by:    Date Additional Medicare IM Given:    Additional Medicare Important Message give by:     If discussed at Long Length of Stay Meetings, dates discussed:    Additional Comments: Pt discharged home today with po AB. [facility] so no [facility] is not needed. Arlyss QueenBlackwell, Kiaya Halib[place]ton Hoperowder, RN 11/26/2014, 1:47 PM

## 2014-11-26 NOTE — Anesthesia Postprocedure Evaluation (Signed)
  Anesthesia Post-op Note  Patient: Brenda Keller  Procedure(s) Performed: Procedure(s): TRANSESOPHAGEAL ECHOCARDIOGRAM (TEE) WITH PROPOFOL (N/A)  Patient Location: PACU  Anesthesia Type:MAC  Level of Consciousness: awake, alert , oriented and patient cooperative  Airway and Oxygen Therapy: Patient Spontanous Breathing and Patient connected to face mask oxygen  Post-op Pain: none  Post-op Assessment: Post-op Vital signs reviewed, Patient's Cardiovascular Status Stable, Respiratory Function Stable, Patent Airway, No signs of Nausea or vomiting and Pain level controlled  Post-op Vital Signs: Reviewed and stable  Last Vitals:  Filed Vitals:   11/26/14 1030  BP: 114/79  Pulse:   Temp:   Resp: 19    Complications: No apparent anesthesia complications

## 2014-11-26 NOTE — Clinical Social Work Note (Signed)
Patient will be discharging on oral antibiotics.  She will not need placement.   CSW signing off.   Annice NeedySettle, Ervine Witucki D, Kentucky[place]CS[place] [phone]

## 2014-11-27 ENCOUNTER — Encounter (HOSPITAL_COMMUNITY): Payer: Self-pay | Admitting: Cardiovascular Disease

## 2014-11-27 LAB — CULTURE, BLOOD (ROUTINE X 2)

## 2014-12-01 LAB — CULTURE, BLOOD (ROUTINE X 2)
Culture: NO GROWTH
Culture: NO GROWTH

## 2016-02-15 ENCOUNTER — Encounter (HOSPITAL_COMMUNITY): Payer: Self-pay | Admitting: Emergency Medicine

## 2016-02-15 ENCOUNTER — Emergency Department (HOSPITAL_COMMUNITY): Payer: Self-pay

## 2016-02-15 ENCOUNTER — Emergency Department (HOSPITAL_COMMUNITY)
Admission: EM | Admit: 2016-02-15 | Discharge: 2016-02-15 | Disposition: A | Discharge status: Home / Self Care | Payer: Self-pay | Attending: Emergency Medicine | Admitting: Emergency Medicine

## 2016-02-15 DIAGNOSIS — M7989 Other specified soft tissue disorders: Secondary | ICD-10-CM | POA: Insufficient documentation

## 2016-02-15 DIAGNOSIS — K0889 Other specified disorders of teeth and supporting structures: Secondary | ICD-10-CM | POA: Insufficient documentation

## 2016-02-15 DIAGNOSIS — F17210 Nicotine dependence, cigarettes, uncomplicated: Secondary | ICD-10-CM | POA: Insufficient documentation

## 2016-02-15 DIAGNOSIS — N390 Urinary tract infection, site not specified: Secondary | ICD-10-CM | POA: Insufficient documentation

## 2016-02-15 LAB — URINALYSIS, ROUTINE W REFLEX MICROSCOPIC
BILIRUBIN URINE: NEGATIVE
Glucose, UA: NEGATIVE mg/dL
Ketones, ur: NEGATIVE mg/dL
Leukocytes, UA: NEGATIVE
NITRITE: POSITIVE — AB
Protein, ur: NEGATIVE mg/dL
pH: 5.5 (ref 5.0–8.0)

## 2016-02-15 LAB — URINE MICROSCOPIC-ADD ON

## 2016-02-15 LAB — COMPREHENSIVE METABOLIC PANEL
ALK PHOS: 89 U/L (ref 38–126)
ALT: 18 U/L (ref 14–54)
ANION GAP: 5 (ref 5–15)
AST: 29 U/L (ref 15–41)
Albumin: 3.2 g/dL — ABNORMAL LOW (ref 3.5–5.0)
BUN: 6 mg/dL (ref 6–20)
CALCIUM: 8.8 mg/dL — AB (ref 8.9–10.3)
CO2: 29 mmol/L (ref 22–32)
Chloride: 103 mmol/L (ref 101–111)
Creatinine, Ser: 0.53 mg/dL (ref 0.44–1.00)
GFR calc non Af Amer: >60 mL/min (ref >60)
GLUCOSE: 91 mg/dL (ref 65–99)
POTASSIUM: 3.1 mmol/L — AB (ref 3.5–5.1)
SODIUM: 137 mmol/L (ref 135–145)
Total Bilirubin: 0.3 mg/dL (ref 0.3–1.2)
Total Protein: 8 g/dL (ref 6.5–8.1)

## 2016-02-15 LAB — CBC WITH DIFFERENTIAL/PLATELET
BASOS ABS: 0 10*3/uL (ref 0.0–0.1)
BASOS PCT: 0 %
Eosinophils Absolute: 0 10*3/uL (ref 0.0–0.7)
Eosinophils Relative: 1 %
HEMATOCRIT: 29.5 % — AB (ref 36.0–46.0)
HEMOGLOBIN: 9.5 g/dL — AB (ref 12.0–15.0)
LYMPHS PCT: 37 %
Lymphs Abs: 2.1 10*3/uL (ref 0.7–4.0)
MCH: 24.9 pg — ABNORMAL LOW (ref 26.0–34.0)
MCHC: 32.2 g/dL (ref 30.0–36.0)
MCV: 77.2 fL — AB (ref 78.0–100.0)
MONO ABS: 0.1 10*3/uL (ref 0.1–1.0)
MONOS PCT: 2 %
NEUTROS ABS: 3.5 10*3/uL (ref 1.7–7.7)
NEUTROS PCT: 60 %
Platelets: 299 10*3/uL (ref 150–400)
RBC: 3.82 MIL/uL — ABNORMAL LOW (ref 3.87–5.11)
RDW: 17.6 % — AB (ref 11.5–15.5)
WBC: 5.8 10*3/uL (ref 4.0–10.5)

## 2016-02-15 LAB — LIPASE, BLOOD: Lipase: 23 U/L (ref 11–51)

## 2016-02-15 LAB — BRAIN NATRIURETIC PEPTIDE: B Natriuretic Peptide: 215 pg/mL — ABNORMAL HIGH (ref 0.0–100.0)

## 2016-02-15 MED ORDER — NITROFURANTOIN MONOHYD MACRO 100 MG PO CAPS: 100.0000 mg | ORAL_CAPSULE | Freq: Two times a day (BID) | ORAL | 0 refills | Status: DC

## 2016-02-15 NOTE — Discharge Instructions (Signed)
RESOURCE GUIDE ° °Chronic Pain Problems: °Contact [facility]  [phone] °Patients need to be referred by their primary care doctor. ° °Insufficient Money for Medicine: °Contact [employer]:  call [phone] ° °No Primary Care Doctor: °Call [facility]  [phone] - can help you locate a primary care doctor that  accepts your insurance, provides certain services, etc. °Physician Referral Service- [phone] ° °Agencies that provide inexpensive medical care: °[facility]  [phone] °[facility]  [phone] °[facility]  [phone] °[facility]  [phone] °[facility]  [phone] °[facility]  [phone] ° °Medicaid-accepting [place] Providers: °[facility]- [address] ° [phone], Mon-Fri 9am-7pm, Sat 9am-1pm °[facility]- [address], [address] ° [phone] °[facility]- [address], [address] ° [phone] °[facility]- [address] [place] [address] ° [phone] °[facility]- [address], [address], [phone] ° Only accepts [place] Access Medicaid patients after they have their name  applied to their card ° °Self Pay (no insurance) in [place]: °Sickle Cell Patients - [facility] ° [address], [phone] °[facility]- [address] ° [phone] °      -     [facility]- [address] °      -     [facility]- see information above (Speak to Pam H if you do not have insurance) °      -  [facility] [place]- [address],  [phone] °      -  [facility]- [address] [place] [address], [phone] °      -  Dr Osei-Bonsu-  [address], [place], [phone] °      -  [facility] - [address], [phone] °      -  [facility]- [address] [place] [address], [phone], also [address] °  [address],  [phone] °      -     [facility]- [address], [phone], 1st & 3rd Saturday °        every month, 10am-1pm ° -     [facility] °  [address], [place]. °  Phone:  [phone], Fax:  [phone]. Hours of Operation:  9 am - 6 pm, M-F. ° -     [facility] °  [address], [address], [place] °  Phone: [phone], Fax: [phone]. Hours of Operation:  8:30 am - 5:30 pm, M-F. ° ° ° °Dental Assistance °If unable to pay or uninsured, contact:  [facility] to become qualified for the adult dental clinic. ° °Patients with Medicaid: [facility] °[address], [phone] °[address], [phone] ° °If unable to pay, or uninsured, contact [place] [facility] ([phone] in [place], [phone] in [place]) to become qualified for the adult dental clinic ° °[facility] °[address] °[place], [place] [address] °[phone] °[email or web address] ° °Other Low-Cost Community Dental Services: °[facility]- [address], [place], [place], [address], [phone], Ext. [phone], 2nd and 4th Thursday of the month at 6:30am.  10 clients each day by appointment, can sometimes see walk-in patients if someone does not show for an appointment. °  [facility]- [address], [place], [place], [address], [phone] °[facility]- [address], [place], , [address], [phone] °[facility]- [phone] °[facility]- [phone] °[facility]- [phone]  °

## 2016-02-15 NOTE — ED Provider Notes (Signed)
Emergency Department Provider Note   I have reviewed the triage vital signs and the nursing notes.   HISTORY  Chief Complaint Leg Swelling   HPI Brenda Keller is a 30 y.o. female with PMH of IVDA and prior endocarditis presents to the emergency department for evaluation of bilateral lower extremity edema and petechial rash. Patient reports one prior episode of edema around the ankles several weeks ago. She took some of the family members Lasix and the edema resolved entirely. Proximally 2 weeks ago she began developing a faint rash over both legs. The rash has progressively worsened over that time and now the lower extremity edema has returned. She describes it as symmetrical and not rising significantly above the ankles. She denies any associated fever, shaking chills, body aches, difficulty breathing, or chest pain. Patient does continue to use IV drugs. She denies any areas of skin infection and reports that she never uses he legs for IV drug use.   Past Medical History:  Diagnosis Date  . Depression     Patient Active Problem List   Diagnosis Date Noted  . Protein-calorie malnutrition, severe (HCC) 11/26/2014  . 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 Staphylococcus aureus bacteremia with sepsis (HCC) 11/25/2014  . Nicotine dependence 11/25/2014  . Sepsis (HCC) 11/23/2014  . Fever 11/23/2014  . Opiate dependence (HCC) 06/26/2013    Past Surgical History:  Procedure Laterality Date  . MOUTH SURGERY    . TEE WITHOUT CARDIOVERSION N/A 11/26/2014   Procedure: TRANSESOPHAGEAL ECHOCARDIOGRAM (TEE) WITH PROPOFOL;  Surgeon: Laqueta Linden, MD;  Location: [facility];  Service: Cardiology;  Laterality: N/A;    Current Outpatient Rx  . Order #: [id number] Class: Historical Med  . Order #: [id number] Class: Print    Allergies Coconut flavor and Hydrocodone  Family History  Problem Relation Age of Onset  . Cancer Other     Social History Social History  Substance  Use Topics  . Smoking status: Current Every Day Smoker    Packs/day: 1.00    Years: 5.00    Types: Cigarettes  . Smokeless tobacco: Never Used  . Alcohol use No    Review of Systems  Constitutional: No fever/chills Eyes: No visual changes. ENT: No sore throat. Cardiovascular: Denies chest pain. Respiratory: Denies shortness of breath. Gastrointestinal: No abdominal pain.  No nausea, no vomiting.  No diarrhea.  No constipation. Genitourinary: Negative for dysuria. Musculoskeletal: Negative for back pain. Positive LE edema bilaterally.  Skin: Positive for rash. Neurological: Negative for headaches, focal weakness or numbness.  10-point ROS otherwise negative.  ____________________________________________   PHYSICAL EXAM:  VITAL SIGNS: ED Triage Vitals  Enc Vitals Group     BP 02/15/16 1624 124/55     Pulse Rate 02/15/16 1624 83     Resp 02/15/16 1624 16     Temp 02/15/16 1624 98.2 F (36.8 C)     Temp Source 02/15/16 1624 Temporal     SpO2 02/15/16 1624 100 %     Weight 02/15/16 1624 104 lb (47.2 kg)     Height 02/15/16 1624  (1.651 m)     Pain Score 02/15/16 1625 7   Constitutional: Alert and oriented. Thin but in no acute distress.  Eyes: Conjunctivae are normal. PERRL. EOMI. Head: Atraumatic. Nose: No congestion/rhinnorhea. Mouth/Throat: Mucous membranes are moist.  Oropharynx non-erythematous. Neck: No stridor.  No meningeal signs.  Cardiovascular: Normal rate, regular rhythm. No murmurs appreciated. Good peripheral circulation. Grossly normal heart sounds.   Respiratory: Normal  respiratory effort.  No retractions. Lungs CTAB. Gastrointestinal: Soft and nontender. No distention.  Musculoskeletal: No lower extremity tenderness with positive pitting edema in the bilateral ankles extending to the distal 1/3 of the tibia bilaterally. No gross deformities of extremities. Neurologic:  Normal speech and language. No gross focal neurologic deficits are appreciated.   Skin:  Skin is warm, dry and intact. Scattered, punctate, erythematous rash over bilateral LE. No excoriation.  Psychiatric: Mood and affect are normal. Speech and behavior are normal.  ____________________________________________   LABS (all labs ordered are listed, but only abnormal results are displayed)  Labs Reviewed  URINALYSIS, ROUTINE W REFLEX MICROSCOPIC (NOT AT [facility]) - Abnormal; Notable for the following:       Result Value   APPearance HAZY (*)    Specific Gravity, Urine >1.030 (*)    Hgb urine dipstick MODERATE (*)    Nitrite POSITIVE (*)    All other components within normal limits  COMPREHENSIVE METABOLIC PANEL - Abnormal; Notable for the following:    Potassium 3.1 (*)    Calcium 8.8 (*)    Albumin 3.2 (*)    All other components within normal limits  BRAIN NATRIURETIC PEPTIDE - Abnormal; Notable for the following:    B Natriuretic Peptide 215.0 (*)    All other components within normal limits  CBC WITH DIFFERENTIAL/PLATELET - Abnormal; Notable for the following:    RBC 3.82 (*)    Hemoglobin 9.5 (*)    HCT 29.5 (*)    MCV 77.2 (*)    MCH 24.9 (*)    RDW 17.6 (*)    All other components within normal limits  URINE MICROSCOPIC-ADD ON - Abnormal; Notable for the following:    Squamous Epithelial / LPF 6-30 (*)    Bacteria, UA MANY (*)    All other components within normal limits  CULTURE, BLOOD (ROUTINE X 2)  CULTURE, BLOOD (ROUTINE X 2)  LIPASE, BLOOD   ____________________________________________  RADIOLOGY  Dg Chest 2 View  Result Date: 02/15/2016 CLINICAL DATA:  Bilateral lower extremity swelling EXAM: CHEST  2 VIEW COMPARISON:  11/23/2014 FINDINGS: Normal cardiac silhouette and mediastinal contours. The lungs are hyperexpanded with flattening of the diaphragms. No focal airspace opacities. No pleural effusion or pneumothorax. No evidence of edema. There is minimal pleural parenchymal thickening about the right minor and bilateral major fissures. No  acute osseus abnormalities. IMPRESSION: Hyperexpanded lungs without acute cardiopulmonary disease. Specifically, no definite evidence of edema. Electronically Signed   By: Simonne Come M.D.   On: 02/15/2016 18:39    ____________________________________________   PROCEDURES  Procedure(s) performed:   Procedures  None ____________________________________________   INITIAL IMPRESSION / ASSESSMENT AND PLAN / ED COURSE  Pertinent labs & imaging results that were available during my care of the patient were reviewed by me and considered in my medical decision making (see chart for details).  Patient resents to the emergency department for evaluation of bilateral lower extremity edema and petechial rash over the bilateral lower extremities. Patient has no signs or symptoms of underlying infectious process. She reports being completely asymptomatic with this swelling and rash. She does have a history of IV drug abuse and last used this morning. She also has a history of endocarditis. No signs on physical exam to suggest active disease. The rash seems isolated to her bilateral lower extremities. Patient does have pitting edema around both ankles with minimal extension up into the leg. No cellulitis or ulcerations. No areas of abscess. Plan for blood  cultures, labs including BNP. Discussed with patient that she will likely require more extensive outpatient workup. With lack of infection symptoms my differential for the above condition is more narrow.   Patient with slightly elevated BNP and some bacteria on UA with nitrite positive. Cultures sent as well. Low suspicion for infectious etiology to explain the patient's symptoms although she is an active IVD user. Discussed immediate return to the ED with any fever or chills. Advised outpatient ECHO with PCP with new onset LE edema.  ____________________________________________  FINAL CLINICAL IMPRESSION(S) / ED DIAGNOSES  Final diagnoses:  Leg  swelling  UTI (lower urinary tract infection)  Pain, dental     MEDICATIONS GIVEN DURING THIS VISIT:  None  NEW OUTPATIENT MEDICATIONS STARTED DURING THIS VISIT:  Discharge Medication List as of 02/15/2016  8:28 PM    START taking these medications   Details  nitrofurantoin, macrocrystal-monohydrate, (MACROBID) 100 MG capsule Take 1 capsule (100 mg total) by mouth 2 (two) times daily., Starting Tue 02/15/2016, Print          Note:  This document was prepared using Dragon voice recognition software and may include unintentional dictation errors.  Alona Bene, MD Emergency Medicine   Maia Plan, MD 02/16/16 [phone]

## 2016-02-15 NOTE — ED Triage Notes (Addendum)
Pt reports bilateral ankle and leg swelling x2-3 days, no difficulty breathing, no cp.  Pt has small bumps on bilateral legs that started before the leg swelling begin.  Pt also has broken tooth in left lower mouth causing pain and swelling. Pt reports urine has been dark with odor.

## 2016-02-17 ENCOUNTER — Telehealth: Payer: Self-pay | Admitting: *Deleted

## 2016-02-17 ENCOUNTER — Emergency Department (HOSPITAL_COMMUNITY)
Admission: EM | Admit: 2016-02-17 | Discharge: 2016-02-17 | Disposition: A | Discharge status: Home / Self Care | Payer: Self-pay | Attending: Emergency Medicine | Admitting: Emergency Medicine

## 2016-02-17 ENCOUNTER — Encounter (HOSPITAL_COMMUNITY): Payer: Self-pay | Admitting: Emergency Medicine

## 2016-02-17 ENCOUNTER — Telehealth (HOSPITAL_BASED_OUTPATIENT_CLINIC_OR_DEPARTMENT_OTHER): Payer: Self-pay | Admitting: *Deleted

## 2016-02-17 DIAGNOSIS — Z79899 Other long term (current) drug therapy: Secondary | ICD-10-CM | POA: Insufficient documentation

## 2016-02-17 DIAGNOSIS — N390 Urinary tract infection, site not specified: Secondary | ICD-10-CM | POA: Insufficient documentation

## 2016-02-17 DIAGNOSIS — F17210 Nicotine dependence, cigarettes, uncomplicated: Secondary | ICD-10-CM | POA: Insufficient documentation

## 2016-02-17 LAB — URINALYSIS, ROUTINE W REFLEX MICROSCOPIC
Bilirubin Urine: NEGATIVE
GLUCOSE, UA: NEGATIVE mg/dL
Ketones, ur: NEGATIVE mg/dL
NITRITE: NEGATIVE
PH: 6 (ref 5.0–8.0)
Protein, ur: NEGATIVE mg/dL
SPECIFIC GRAVITY, URINE: 1.015 (ref 1.005–1.030)

## 2016-02-17 LAB — COMPREHENSIVE METABOLIC PANEL
ALBUMIN: 3.1 g/dL — AB (ref 3.5–5.0)
ALK PHOS: 84 U/L (ref 38–126)
ALT: 13 U/L — ABNORMAL LOW (ref 14–54)
ANION GAP: 8 (ref 5–15)
AST: 20 U/L (ref 15–41)
BUN: 5 mg/dL — ABNORMAL LOW (ref 6–20)
CALCIUM: 8.6 mg/dL — AB (ref 8.9–10.3)
CHLORIDE: 99 mmol/L — AB (ref 101–111)
CO2: 31 mmol/L (ref 22–32)
Creatinine, Ser: 0.58 mg/dL (ref 0.44–1.00)
GFR calc Af Amer: >60 mL/min (ref >60)
GFR calc non Af Amer: >60 mL/min (ref >60)
GLUCOSE: 110 mg/dL — AB (ref 65–99)
POTASSIUM: 3.2 mmol/L — AB (ref 3.5–5.1)
SODIUM: 138 mmol/L (ref 135–145)
Total Bilirubin: 0.2 mg/dL — ABNORMAL LOW (ref 0.3–1.2)
Total Protein: 7.8 g/dL (ref 6.5–8.1)

## 2016-02-17 LAB — CBC WITH DIFFERENTIAL/PLATELET
BASOS PCT: 0 %
Basophils Absolute: 0 10*3/uL (ref 0.0–0.1)
EOS ABS: 0.1 10*3/uL (ref 0.0–0.7)
Eosinophils Relative: 2 %
HCT: 27.5 % — ABNORMAL LOW (ref 36.0–46.0)
HEMOGLOBIN: 8.9 g/dL — AB (ref 12.0–15.0)
LYMPHS ABS: 1.5 10*3/uL (ref 0.7–4.0)
Lymphocytes Relative: 26 %
MCH: 24.9 pg — ABNORMAL LOW (ref 26.0–34.0)
MCHC: 32.4 g/dL (ref 30.0–36.0)
MCV: 76.8 fL — ABNORMAL LOW (ref 78.0–100.0)
MONO ABS: 0.2 10*3/uL (ref 0.1–1.0)
Monocytes Relative: 4 %
NEUTROS PCT: 68 %
Neutro Abs: 4.1 10*3/uL (ref 1.7–7.7)
PLATELETS: ADEQUATE 10*3/uL (ref 150–400)
RBC: 3.58 MIL/uL — ABNORMAL LOW (ref 3.87–5.11)
RDW: 17.3 % — ABNORMAL HIGH (ref 11.5–15.5)
WBC: 5.9 10*3/uL (ref 4.0–10.5)

## 2016-02-17 LAB — BLOOD CULTURE ID PANEL (REFLEXED)
ACINETOBACTER BAUMANNII: NOT DETECTED
CANDIDA GLABRATA: NOT DETECTED
CANDIDA KRUSEI: NOT DETECTED
Candida albicans: NOT DETECTED
Candida parapsilosis: NOT DETECTED
Candida tropicalis: NOT DETECTED
Carbapenem resistance: NOT DETECTED
ENTEROBACTERIACEAE SPECIES: NOT DETECTED
ESCHERICHIA COLI: NOT DETECTED
Enterobacter cloacae complex: NOT DETECTED
Enterococcus species: NOT DETECTED
Haemophilus influenzae: NOT DETECTED
KLEBSIELLA OXYTOCA: NOT DETECTED
Klebsiella pneumoniae: NOT DETECTED
Listeria monocytogenes: NOT DETECTED
Methicillin resistance: NOT DETECTED
NEISSERIA MENINGITIDIS: NOT DETECTED
PSEUDOMONAS AERUGINOSA: NOT DETECTED
Proteus species: NOT DETECTED
STAPHYLOCOCCUS SPECIES: NOT DETECTED
STREPTOCOCCUS AGALACTIAE: NOT DETECTED
STREPTOCOCCUS SPECIES: NOT DETECTED
Serratia marcescens: NOT DETECTED
Staphylococcus aureus (BCID): NOT DETECTED
Streptococcus pneumoniae: NOT DETECTED
Streptococcus pyogenes: NOT DETECTED
Vancomycin resistance: NOT DETECTED

## 2016-02-17 LAB — URINE MICROSCOPIC-ADD ON

## 2016-02-17 MED ORDER — DEXTROSE 5 % IV SOLN
1.0000 g | Freq: Once | INTRAVENOUS | Status: AC
  Administered 2016-02-17: 1 g via INTRAVENOUS
  Filled 2016-02-17: qty 10

## 2016-02-17 MED ORDER — SULFAMETHOXAZOLE-TRIMETHOPRIM 800-160 MG PO TABS: 1.0000 | ORAL_TABLET | Freq: Two times a day (BID) | ORAL | 0 refills | Status: AC

## 2016-02-17 MED ORDER — SODIUM CHLORIDE 0.9 % IV BOLUS (SEPSIS)
1000.0000 mL | Freq: Once | INTRAVENOUS | Status: AC
  Administered 2016-02-17: 1000 mL via INTRAVENOUS

## 2016-02-17 NOTE — Discharge Instructions (Signed)
Stop taking your Macrobid. Start taking the Bactrim. Return if fever or worsening symptoms. Call here Saturday morning to find out whether the antibiotic you are taking is appropriate for this infection. The sensitivities from the blood culture should be ready Saturday morning

## 2016-02-17 NOTE — Telephone Encounter (Signed)
Post ED Visit - Positive Culture Follow-up: Successful Patient Follow-Up  Culture assessed and recommendations reviewed by: []  Enzo Bi, Pharm.D. []  Celedonio Miyamoto, Pharm.D., BCPS []  Garvin Fila, Pharm.D. []  Georgina Pillion, Pharm.D., BCPS []  Terry, [address].D., BCPS, AAHIVP []  Estella Husk, Pharm.D., BCPS, AAHIVP []  Tennis Must, Pharm.D. []  Sherle Poe, [address].D.  Positive blood culture  []  Patient discharged without antimicrobial prescription and treatment is now indicated []  Organism is resistant to prescribed ED discharge antimicrobial [x]  Patient with positive blood cultures  Changes discussed with ED provider [name] PA-C Patient inform to return to Ed for further treatment Contacted patient, date 08/03 2017 time 11.14am   Brenda Keller, Dixon Boos 02/17/2016, 11:18 AM

## 2016-02-17 NOTE — ED Notes (Signed)
Patient given discharge instruction, verbalized understand. Patient has number to call Company secretary for results on Saturday. IV removed, band aid applied. Patient ambulatory out of the department.

## 2016-02-17 NOTE — ED Notes (Signed)
This nurse made one attempt at IV on R-AC. Unsuccessful. Pt has notable scarring to veins related to at home IV drug use.

## 2016-02-17 NOTE — ED Triage Notes (Signed)
Reports that she was seen in ED on 8/1. Received phone call stating she needed to come in for further evaluation due to positive blood cultures. Pt has no complaints at this time.

## 2016-02-17 NOTE — ED Notes (Signed)
Lab at bedside

## 2016-02-17 NOTE — ED Notes (Signed)
MD at bedside. 

## 2016-02-17 NOTE — ED Provider Notes (Signed)
[facility]-EMER[facility]Y DEPT Provider Note   CSN: [id number] Arrival date & time: 02/17/16  1514  First Provider Contact:  None       History   Chief Complaint Chief Complaint  Patient presents with  . Abnormal Lab    HPI LLEWELLYN ASWAD is a 30 y.o. female.  Patient returned because she had a positive blood culture. Patient had gram negative rods in her blood. This was only in one blood culture she was being treated for UTI with Macrobid   The history is provided by the patient. No language interpreter was used.  Abnormal Lab  Time since result:  2 days Patient referred by:  ED personnel   Past Medical History:  Diagnosis Date  . Depression     Patient Active Problem List   Diagnosis Date Noted  . Protein-calorie malnutrition, severe (HCC) 11/26/2014  . 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 Staphylococcus aureus bacteremia with sepsis (HCC) 11/25/2014  . Nicotine dependence 11/25/2014  . Sepsis (HCC) 11/23/2014  . Fever 11/23/2014  . Opiate dependence (HCC) 06/26/2013    Past Surgical History:  Procedure Laterality Date  . MOUTH SURGERY    . TEE WITHOUT CARDIOVERSION N/A 11/26/2014   Procedure: TRANSESOPHAGEAL ECHOCARDIOGRAM (TEE) WITH PROPOFOL;  Surgeon: Laqueta Linden, MD;  Location: [facility];  Service: Cardiology;  Laterality: N/A;    OB History    Gravida Para Term Preterm AB Living   2 2 2     2    SAB TAB Ectopic Multiple Live Births                   Home Medications    Prior to Admission medications   Medication Sig Start Date End Date Taking? Authorizing Provider  nitrofurantoin, macrocrystal-monohydrate, (MACROBID) 100 MG capsule Take 1 capsule (100 mg total) by mouth 2 (two) times daily. 02/15/16  Yes Maia Plan, MD  Etonogestrel [facility]) Inject into the skin.      Historical Provider, MD  sulfamethoxazole-trimethoprim (BACTRIM DS,SEPTRA DS) 800-160 MG tablet Take 1 tablet by mouth 2 (two) times daily. 02/17/16 02/24/16  Bethann Berkshire, MD    Family History Family History  Problem Relation Age of Onset  . Cancer Other     Social History Social History  Substance Use Topics  . Smoking status: Current Every Day Smoker    Packs/day: 1.00    Years: 5.00    Types: Cigarettes  . Smokeless tobacco: Never Used  . Alcohol use No     Allergies   Coconut flavor and Hydrocodone   Review of Systems Review of Systems  Constitutional: Negative for appetite change and fatigue.  HENT: Negative for congestion, ear discharge and sinus pressure.   Eyes: Negative for discharge.  Respiratory: Negative for cough.   Cardiovascular: Negative for chest pain.  Gastrointestinal: Negative for abdominal pain and diarrhea.  Genitourinary: Negative for frequency and hematuria.  Musculoskeletal: Negative for back pain.  Skin: Negative for rash.  Neurological: Negative for seizures and headaches.  Psychiatric/Behavioral: Negative for hallucinations.     Physical Exam Updated Vital Signs BP 119/81   Pulse 67   Temp 97.7 F (36.5 C) (Temporal)   Resp 18   Ht 5\' 5"  (1.651 m)   Wt 104 lb (47.2 kg)   LMP 02/15/2016   SpO2 100%   BMI 17.31 kg/m   Physical Exam  Constitutional: She is oriented to person, place, and time. She appears well-developed.  HENT:  Head: Normocephalic.  Eyes: Conjunctivae and EOM are normal. No scleral icterus.  Neck: Neck supple. No thyromegaly present.  Cardiovascular: Normal rate and regular rhythm.  Exam reveals no gallop and no friction rub.   No murmur heard. Pulmonary/Chest: No stridor. She has no wheezes. She has no rales. She exhibits no tenderness.  Abdominal: She exhibits no distension. There is no tenderness. There is no rebound.  Musculoskeletal: Normal range of motion. She exhibits no edema.  Lymphadenopathy:    She has no cervical adenopathy.  Neurological: She is oriented to person, place, and time. She exhibits normal muscle tone. Coordination normal.  Skin: No rash  noted. No erythema.  Psychiatric: She has a normal mood and affect. Her behavior is normal.     ED Treatments / Results  Labs (all labs ordered are listed, but only abnormal results are displayed) Labs Reviewed  CBC WITH DIFFERENTIAL/PLATELET - Abnormal; Notable for the following:       Result Value   RBC 3.58 (*)    Hemoglobin 8.9 (*)    HCT 27.5 (*)    MCV 76.8 (*)    MCH 24.9 (*)    RDW 17.3 (*)    All other components within normal limits  COMPREHENSIVE METABOLIC PANEL - Abnormal; Notable for the following:    Potassium 3.2 (*)    Chloride 99 (*)    Glucose, Bld 110 (*)    BUN 5 (*)    Calcium 8.6 (*)    Albumin 3.1 (*)    ALT 13 (*)    Total Bilirubin 0.2 (*)    All other components within normal limits  URINALYSIS, ROUTINE W REFLEX MICROSCOPIC (NOT AT [facility]) - Abnormal; Notable for the following:    Hgb urine dipstick TRACE (*)    Leukocytes, UA TRACE (*)    All other components within normal limits  URINE MICROSCOPIC-ADD ON - Abnormal; Notable for the following:    Squamous Epithelial / LPF 6-30 (*)    Bacteria, UA FEW (*)    All other components within normal limits  URINE CULTURE    EKG  EKG Interpretation None       Radiology No results found.  Procedures Procedures (including critical care time)  Medications Ordered in ED Medications  sodium chloride 0.9 % bolus 1,000 mL (0 mLs Intravenous Stopped 02/17/16 1820)  cefTRIAXone (ROCEPHIN) 1 g in dextrose 5 % 50 mL IVPB (0 g Intravenous Stopped 02/17/16 1729)     Initial Impression / Assessment and Plan / ED Course  I have reviewed the triage vital signs and the nursing notes.  Pertinent labs & imaging results that were available during my care of the patient were reviewed by me and considered in my medical decision making (see chart for details).  Clinical Course  Patient with urinary tract infection and one positive blood culture. Patient has no symptoms. I spoke with Dr. Orvan Falconer the infectious  disease doctor. He stated the patient does not need to be admitted to the hospital. He stated to put the patient on Bactrim. He stated that once the sensitivities are back someone needs to check and make sure the back times appropriate for this infection. The sensitivities should be back in 2 days. The patient is going to call back in 2 days to check    Final Clinical Impressions(s) / ED Diagnoses   Final diagnoses:  UTI (lower urinary tract infection)    New Prescriptions New Prescriptions   SULFAMETHOXAZOLE-TRIMETHOPRIM (BACTRIM DS,SEPTRA DS) 800-160 MG TABLET  Take 1 tablet by mouth 2 (two) times daily.     Bethann Berkshire, MD 02/17/16 1919

## 2016-02-19 ENCOUNTER — Telehealth (HOSPITAL_COMMUNITY): Payer: Self-pay

## 2016-02-19 ENCOUNTER — Telehealth (HOSPITAL_BASED_OUTPATIENT_CLINIC_OR_DEPARTMENT_OTHER): Payer: Self-pay

## 2016-02-19 LAB — URINE CULTURE

## 2016-02-19 NOTE — Telephone Encounter (Signed)
Pt calling for lab results form her visit 02/17/2016.  Informed her urine culture is suggesting recollection.

## 2016-02-20 LAB — CULTURE, BLOOD (ROUTINE X 2): CULTURE: NO GROWTH

## 2016-02-21 LAB — CULTURE, BLOOD (ROUTINE X 2)

## 2016-02-22 ENCOUNTER — Telehealth (HOSPITAL_BASED_OUTPATIENT_CLINIC_OR_DEPARTMENT_OTHER): Payer: Self-pay | Admitting: *Deleted

## 2016-02-22 NOTE — Telephone Encounter (Signed)
Post ED Visit - Positive Culture Follow-up  Culture report reviewed by antimicrobial stewardship pharmacist:  []  Brenda Keller, Pharm.D. []  Brenda Keller, Pharm.D., BCPS []  Brenda Keller, Pharm.D. []  Brenda Keller, Pharm.D., BCPS []  Brenda Keller, [address]Pharm.D., BCPS, AAHIVP []  Brenda Keller, Pharm.D., BCPS, AAHIVP []  Brenda Keller, Pharm.D. []  Brenda Keller, [place]arm[place]D. Vianne BullsKai Kong PharmD Positive Burkholderia blood culture Treated with Bactrim on 02/17/16 x 10 days organism sensitive to the same and no further patient follow-up is required at this time.  Brenda Keller, Brenda Keller 02/22/2016, 3:46 PM

## 2017-09-12 ENCOUNTER — Encounter (HOSPITAL_COMMUNITY): Payer: Self-pay | Admitting: Emergency Medicine

## 2017-09-12 ENCOUNTER — Emergency Department (HOSPITAL_COMMUNITY)
Admission: EM | Admit: 2017-09-12 | Discharge: 2017-09-12 | Disposition: A | Discharge status: Home / Self Care | Payer: Self-pay | Attending: Emergency Medicine | Admitting: Emergency Medicine

## 2017-09-12 ENCOUNTER — Other Ambulatory Visit: Payer: Self-pay

## 2017-09-12 DIAGNOSIS — K047 Periapical abscess without sinus: Secondary | ICD-10-CM | POA: Insufficient documentation

## 2017-09-12 DIAGNOSIS — F17210 Nicotine dependence, cigarettes, uncomplicated: Secondary | ICD-10-CM | POA: Insufficient documentation

## 2017-09-12 DIAGNOSIS — Z79899 Other long term (current) drug therapy: Secondary | ICD-10-CM | POA: Insufficient documentation

## 2017-09-12 LAB — CBC WITH DIFFERENTIAL/PLATELET
Basophils Absolute: 0 10*3/uL (ref 0.0–0.1)
Basophils Relative: 0 %
Eosinophils Absolute: 0.1 10*3/uL (ref 0.0–0.7)
Eosinophils Relative: 1 %
HEMATOCRIT: 37.6 % (ref 36.0–46.0)
Hemoglobin: 11.7 g/dL — ABNORMAL LOW (ref 12.0–15.0)
LYMPHS PCT: 36 %
Lymphs Abs: 2.4 10*3/uL (ref 0.7–4.0)
MCH: 25.8 pg — AB (ref 26.0–34.0)
MCHC: 31.1 g/dL (ref 30.0–36.0)
MCV: 82.8 fL (ref 78.0–100.0)
MONO ABS: 0.4 10*3/uL (ref 0.1–1.0)
Monocytes Relative: 7 %
NEUTROS ABS: 3.7 10*3/uL (ref 1.7–7.7)
Neutrophils Relative %: 57 %
Platelets: 233 10*3/uL (ref 150–400)
RBC: 4.54 MIL/uL (ref 3.87–5.11)
RDW: 17.1 % — AB (ref 11.5–15.5)
WBC: 6.6 10*3/uL (ref 4.0–10.5)

## 2017-09-12 LAB — BASIC METABOLIC PANEL
Anion gap: 12 (ref 5–15)
BUN: 7 mg/dL (ref 6–20)
CALCIUM: 9.4 mg/dL (ref 8.9–10.3)
CO2: 26 mmol/L (ref 22–32)
CREATININE: 0.54 mg/dL (ref 0.44–1.00)
Chloride: 100 mmol/L — ABNORMAL LOW (ref 101–111)
GFR calc Af Amer: >60 mL/min (ref >60)
GFR calc non Af Amer: >60 mL/min (ref >60)
GLUCOSE: 74 mg/dL (ref 65–99)
Potassium: 3.1 mmol/L — ABNORMAL LOW (ref 3.5–5.1)
Sodium: 138 mmol/L (ref 135–145)

## 2017-09-12 MED ORDER — TRAMADOL HCL 50 MG PO TABS
50.0000 mg | ORAL_TABLET | Freq: Once | ORAL | Status: AC
Start: 2017-09-12 — End: 2017-09-12
  Administered 2017-09-12: 50 mg via ORAL
  Filled 2017-09-12: qty 1

## 2017-09-12 MED ORDER — TRAMADOL HCL 50 MG PO TABS: 50.0000 mg | ORAL_TABLET | Freq: Four times a day (QID) | ORAL | 0 refills | Status: DC | PRN

## 2017-09-12 MED ORDER — AMOXICILLIN 500 MG PO CAPS: 500.0000 mg | ORAL_CAPSULE | Freq: Three times a day (TID) | ORAL | 0 refills | Status: AC

## 2017-09-12 MED ORDER — IBUPROFEN 400 MG PO TABS
400.0000 mg | ORAL_TABLET | Freq: Once | ORAL | Status: AC
  Administered 2017-09-12: 400 mg via ORAL
  Filled 2017-09-12: qty 1

## 2017-09-12 MED ORDER — AMOXICILLIN 250 MG PO CAPS
500.0000 mg | ORAL_CAPSULE | Freq: Once | ORAL | Status: AC
  Administered 2017-09-12: 500 mg via ORAL
  Filled 2017-09-12: qty 2

## 2017-09-12 MED ORDER — IBUPROFEN 600 MG PO TABS: 600.0000 mg | ORAL_TABLET | Freq: Four times a day (QID) | ORAL | 0 refills | Status: DC | PRN

## 2017-09-12 NOTE — Discharge Instructions (Signed)
Complete your entire course of antibiotics as prescribed.  You  may use the tramadol for pain relief but do not drive within 4 hours of taking as this will make you drowsy.  Use ibuprofen which will also help with pain and swelling. Avoid applying heat or ice to this abscess area which can worsen your symptoms.  You may use warm salt water swish and spit treatment or half peroxide and water swish and spit after meals to keep this area clean as discussed.  Call the dentist listed above for further management of your symptoms.

## 2017-09-12 NOTE — ED Triage Notes (Signed)
Left facial swelling from dental caries since yesterday.

## 2017-09-12 NOTE — ED Provider Notes (Signed)
[facility] EMERGENCY DEPARTMENT Provider Note   CSN: [id number] Arrival date & time: 09/12/17  1416     History   Chief Complaint Chief Complaint  Patient presents with  . Dental Pain    HPI Brenda Keller is a 32 y.o. female with a history of upper full dental extractions and dentures presenting with a 1 day history of dental pain and gingival swelling.   The patient has a history of decay of her lower molars which has recently started to cause increased  pain.  There has been no fevers, chills, nausea or vomiting, also no complaint of difficulty swallowing, although chewing makes pain worse.  The patient has tried no medications prior to arrival without relief of symptoms.    .  The history is provided by the patient.    Past Medical History:  Diagnosis Date  . Depression     Patient Active Problem List   Diagnosis Date Noted  . Protein-calorie malnutrition, severe (HCC) 11/26/2014  . 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 Staphylococcus aureus bacteremia with sepsis (HCC) 11/25/2014  . Nicotine dependence 11/25/2014  . Sepsis (HCC) 11/23/2014  . Fever 11/23/2014  . Opiate dependence (HCC) 06/26/2013    Past Surgical History:  Procedure Laterality Date  . MOUTH SURGERY    . TEE WITHOUT CARDIOVERSION N/A 11/26/2014   Procedure: TRANSESOPHAGEAL ECHOCARDIOGRAM (TEE) WITH PROPOFOL;  Surgeon: Laqueta Linden, MD;  Location: [facility] OR[facility];  Service: Cardiology;  Laterality: N/A;    OB History    Gravida Para Term Preterm AB Living   2 2 2     2    SAB TAB Ectopic Multiple Live Births                   Home Medications    Prior to Admission medications   Medication Sig Start Date End Date Taking? Authorizing Provider  amoxicillin (AMOXIL) 500 MG capsule Take 1 capsule (500 mg total) by mouth 3 (three) times daily for 10 days. 09/12/17 09/22/17  Burgess Amor, PA-C  Etonogestrel (IMPLANON [place]) Inject into the skin.      [provider]  ibuprofen  (ADVIL,MOTRIN) 600 MG tablet Take 1 tablet (600 mg total) by mouth every 6 (six) hours as needed. 09/12/17   Burgess Amor, PA-C  nitrofurantoin, macrocrystal-monohydrate, (MACROBID) 100 MG capsule Take 1 capsule (100 mg total) by mouth 2 (two) times daily. 02/15/16   Long, Arlyss Repress, MD  traMADol (ULTRAM) 50 MG tablet Take 1 tablet (50 mg total) by mouth every 6 (six) hours as needed. 09/12/17   Burgess Amor, PA-C    Family History Family History  Problem Relation Age of Onset  . Cancer Other     Social History Social History   Tobacco Use  . Smoking status: Current Every Day Smoker    Packs/day: 1.00    Years: 5.00    Pack years: 5.00    Types: Cigarettes  . Smokeless tobacco: Never Used  Substance Use Topics  . Alcohol use: No  . Drug use: Yes    Types: Oxycodone, IV    Comment: Oxycodone pills crushed up and injected through IV on a daily basis . states quit 2-3 months ago.     Allergies   Coconut flavor and Hydrocodone   Review of Systems Review of Systems  Constitutional: Negative for fever.  HENT: Positive for dental problem. Negative for facial swelling and sore throat.   Respiratory: Negative for shortness of breath.   Musculoskeletal:  Negative for neck pain and neck stiffness.     Physical Exam Updated Vital Signs BP 100/70 (BP Location: Left Arm)   Pulse 86   Temp 98.2 F (36.8 C) (Oral)   Resp 14   Ht 5\' 5"  (1.651 m)   Wt 54.4 kg (120 lb)   SpO2 99%   BMI 19.97 kg/m   Physical Exam  Constitutional: She is oriented to person, place, and time. She appears well-developed and well-nourished. No distress.  HENT:  Head: Normocephalic and atraumatic.  Right Ear: Tympanic membrane and external ear normal.  Left Ear: Tympanic membrane and external ear normal.  Mouth/Throat: Oropharynx is clear and moist and mucous membranes are normal. No oral lesions. No trismus in the jaw. Dental abscesses present.  Soft, nonpointing edema left lower cheek and jawline.  Left  lower molars with severe decay and gingival erythema and edema, most likely 1st lower left molar as source of todays infection. No facial erythema or induration.  Eyes: Conjunctivae are normal.  Neck: Normal range of motion. Neck supple.  Cardiovascular: Normal rate and normal heart sounds.  Pulmonary/Chest: Effort normal.  Musculoskeletal: Normal range of motion.  Lymphadenopathy:    She has no cervical adenopathy.  Neurological: She is alert and oriented to person, place, and time.  Skin: Skin is warm and dry. No erythema.  Psychiatric: She has a normal mood and affect.     ED Treatments / Results  Labs (all labs ordered are listed, but only abnormal results are displayed) Labs Reviewed  CBC WITH DIFFERENTIAL/PLATELET - Abnormal; Notable for the following components:      Result Value   Hemoglobin 11.7 (*)    MCH 25.8 (*)    RDW 17.1 (*)    All other components within normal limits  BASIC METABOLIC PANEL - Abnormal; Notable for the following components:   Potassium 3.1 (*)    Chloride 100 (*)    All other components within normal limits    EKG  EKG Interpretation None       Radiology No results found.  Procedures Procedures (including critical care time)  Medications Ordered in ED Medications  amoxicillin (AMOXIL) capsule 500 mg (not administered)  traMADol (ULTRAM) tablet 50 mg (not administered)  ibuprofen (ADVIL,MOTRIN) tablet 400 mg (not administered)     Initial Impression / Assessment and Plan / ED Course  I have reviewed the triage vital signs and the nursing notes.  Pertinent labs & imaging results that were available during my care of the patient were reviewed by me and considered in my medical decision making (see chart for details).     No trismus or difficulty with swallowing, no drainable abscess at this time.  Amoxil, ibu, tramadol. Avoid heat and ice. Plan f/u with dentistry,  She plans to see [facility] again to have all her lower teeth  pulled when she gets her tax refund. Strict return precautions discussed.  Final Clinical Impressions(s) / ED Diagnoses   Final diagnoses:  Dental abscess    ED Discharge Orders        Ordered    amoxicillin (AMOXIL) 500 MG capsule  3 times daily     09/12/17 1648    ibuprofen (ADVIL,MOTRIN) 600 MG tablet  Every 6 hours PRN     09/12/17 1648    traMADol (ULTRAM) 50 MG tablet  Every 6 hours PRN     09/12/17 1648       Burgess Amordol, Miarose Lippert, PA-C 09/12/17 1656    Zackowski,  Lorin Picket, MD 09/12/17 7829

## 2017-09-21 ENCOUNTER — Emergency Department (HOSPITAL_COMMUNITY)
Admission: EM | Admit: 2017-09-21 | Discharge: 2017-09-21 | Disposition: A | Discharge status: Home / Self Care | Payer: Self-pay | Attending: Emergency Medicine | Admitting: Emergency Medicine

## 2017-09-21 ENCOUNTER — Encounter (HOSPITAL_COMMUNITY): Payer: Self-pay

## 2017-09-21 DIAGNOSIS — F1120 Opioid dependence, uncomplicated: Secondary | ICD-10-CM | POA: Insufficient documentation

## 2017-09-21 DIAGNOSIS — R590 Localized enlarged lymph nodes: Secondary | ICD-10-CM | POA: Insufficient documentation

## 2017-09-21 DIAGNOSIS — Z79899 Other long term (current) drug therapy: Secondary | ICD-10-CM | POA: Insufficient documentation

## 2017-09-21 DIAGNOSIS — F17210 Nicotine dependence, cigarettes, uncomplicated: Secondary | ICD-10-CM | POA: Insufficient documentation

## 2017-09-21 DIAGNOSIS — F329 Major depressive disorder, single episode, unspecified: Secondary | ICD-10-CM | POA: Insufficient documentation

## 2017-09-21 LAB — URINALYSIS, ROUTINE W REFLEX MICROSCOPIC
BILIRUBIN URINE: NEGATIVE
Glucose, UA: NEGATIVE mg/dL
HGB URINE DIPSTICK: NEGATIVE
KETONES UR: NEGATIVE mg/dL
Leukocytes, UA: NEGATIVE
Nitrite: NEGATIVE
Protein, ur: NEGATIVE mg/dL
SPECIFIC GRAVITY, URINE: 1.005 (ref 1.005–1.030)
pH: 6 (ref 5.0–8.0)

## 2017-09-21 LAB — POC URINE PREG, ED: PREG TEST UR: NEGATIVE

## 2017-09-21 MED ORDER — DOXYCYCLINE HYCLATE 100 MG PO CAPS: 100.0000 mg | ORAL_CAPSULE | Freq: Two times a day (BID) | ORAL | 0 refills | Status: DC

## 2017-09-21 NOTE — ED Triage Notes (Signed)
Pt reports that she has an enlarged area to left groin area, possibly lymph node enlargement. Pt noticed it last night and area is painful

## 2017-09-21 NOTE — Discharge Instructions (Signed)
Watch for increased swelling of the area, fever, chills, redness or abdominal pain.  Follow-up with the health department or return here for any worsening symptoms

## 2017-09-21 NOTE — ED Provider Notes (Signed)
[facility] EMERGENCY DEPARTMENT Provider Note   CSN: [id number] Arrival date & time: 09/21/17  1720     History   Chief Complaint Chief Complaint  Patient presents with  . Abscess    HPI Brenda Keller is a 32 y.o. female.  HPI   Brenda Keller is a 32 y.o. female who presents to the Emergency Department complaining of pain and focal area of swelling to her left groin.  States that the area may have possibly been there for some time, she is unsure but noticed it last evening.  She reports pain to the area with palpation and with flexion of her hip.  She denies redness, abdominal pain, fever, chills, or recent illness.  She admits to a history of genital warts.  States that she has had a recent outbreak, but has been unable to see her gynecologist.  She also denies abnormal vaginal bleeding, recent unprotected intercourse , discharge, pelvic pain or dysuria.   Past Medical History:  Diagnosis Date  . Depression     Patient Active Problem List   Diagnosis Date Noted  . Protein-calorie malnutrition, severe (HCC) 11/26/2014  . 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 Staphylococcus aureus bacteremia with sepsis (HCC) 11/25/2014  . Nicotine dependence 11/25/2014  . Sepsis (HCC) 11/23/2014  . Fever 11/23/2014  . Opiate dependence (HCC) 06/26/2013    Past Surgical History:  Procedure Laterality Date  . MOUTH SURGERY    . TEE WITHOUT CARDIOVERSION N/A 11/26/2014   Procedure: TRANSESOPHAGEAL ECHOCARDIOGRAM (TEE) WITH PROPOFOL;  Surgeon: Laqueta LindenSuresh A Koneswaran, MD;  Location: [facility];  Service: Cardiology;  Laterality: N/A;    OB History    Gravida Para Term Preterm AB Living   2 2 2     2    SAB TAB Ectopic Multiple Live Births                   Home Medications    Prior to Admission medications   Medication Sig Start Date End Date Taking? Authorizing Provider  amoxicillin (AMOXIL) 500 MG capsule Take 1 capsule (500 mg total) by mouth 3 (three) times daily for  10 days. 09/12/17 09/22/17  Burgess AmorIdol, Julie, PA-C  Etonogestrel (IMPLANON [place]) Inject into the skin.      [provider]  ibuprofen (ADVIL,MOTRIN) 600 MG tablet Take 1 tablet (600 mg total) by mouth every 6 (six) hours as needed. 09/12/17   Burgess AmorIdol, Julie, PA-C  nitrofurantoin, macrocrystal-monohydrate, (MACROBID) 100 MG capsule Take 1 capsule (100 mg total) by mouth 2 (two) times daily. 02/15/16   Long, Arlyss RepressJoshua G, MD  traMADol (ULTRAM) 50 MG tablet Take 1 tablet (50 mg total) by mouth every 6 (six) hours as needed. 09/12/17   Burgess AmorIdol, Julie, PA-C    Family History Family History  Problem Relation Age of Onset  . Cancer Other     Social History Social History   Tobacco Use  . Smoking status: Current Every Day Smoker    Packs/day: 1.00    Years: 5.00    Pack years: 5.00    Types: Cigarettes  . Smokeless tobacco: Never Used  Substance Use Topics  . Alcohol use: No  . Drug use: Yes    Types: Oxycodone, IV    Comment: Oxycodone pills crushed up and injected through IV on a daily basis .states quit 2-3 months ago.     Allergies   Coconut flavor and Hydrocodone   Review of Systems Review of Systems  Constitutional: Negative for  activity change, appetite change, chills and fever.  HENT: Negative for facial swelling, sore throat and trouble swallowing.   Respiratory: Negative for chest tightness, shortness of breath and wheezing.   Genitourinary: Negative for decreased urine volume, dysuria, flank pain, vaginal bleeding, vaginal discharge and vaginal pain.  Musculoskeletal: Negative for back pain, neck pain and neck stiffness.  Skin: Positive for rash. Negative for wound.  Neurological: Negative for dizziness, weakness, numbness and headaches.  All other systems reviewed and are negative.    Physical Exam Updated Vital Signs BP 108/74 (BP Location: Right Arm)   Pulse (!) 107   Temp 98.5 F (36.9 C) (Oral)   Resp 16   Wt 54.4 kg (120 lb)   SpO2 93%   BMI 19.97 kg/m    Physical Exam  Constitutional: She is oriented to person, place, and time. She appears well-developed and well-nourished. No distress.  HENT:  Head: Normocephalic and atraumatic.  Mouth/Throat: Oropharynx is clear and moist.  Cardiovascular: Normal rate, regular rhythm and intact distal pulses.  No murmur heard. Pulmonary/Chest: Effort normal and breath sounds normal. No respiratory distress.  Abdominal: Soft. She exhibits no distension. There is no tenderness. There is no guarding.  Genitourinary:  Genitourinary Comments: Firm, mobile 2 cm flesh-colored nodule of the left groin.  Surrounding erythema or fluctuance.  Patient does have several verrucous lesions to the vulva and labia majora.  Musculoskeletal: Normal range of motion.  Neurological: She is alert and oriented to person, place, and time. No sensory deficit. She exhibits normal muscle tone. Coordination normal.  Skin: Skin is warm and dry. Capillary refill takes less than 2 seconds. No rash noted. No erythema.  Psychiatric: She has a normal mood and affect.  Nursing note and vitals reviewed.    ED Treatments / Results  Labs (all labs ordered are listed, but only abnormal results are displayed) Labs Reviewed - No data to display  EKG  EKG Interpretation None       Radiology No results found.  Procedures Procedures (including critical care time)  Medications Ordered in ED Medications - No data to display   Initial Impression / Assessment and Plan / ED Course  I have reviewed the triage vital signs and the nursing notes.  Pertinent labs & imaging results that were available during my care of the patient were reviewed by me and considered in my medical decision making (see chart for details).     Patient well-appearing.  Vitals reviewed.  Single nodule of the left groin appears consistent with enlarged lymph node.  Patient is otherwise well-appearing and asymptomatic.  UA negative.  Patient agrees to  treatment plan with close observation, antibiotic, and close follow-up with her gynecologist.  Return precautions were discussed.  Final Clinical Impressions(s) / ED Diagnoses   Final diagnoses:  Lymphadenopathy, inguinal    ED Discharge Orders    None       Rosey Bath 09/21/17 2009    Eber Hong, MD 09/22/17 1616

## 2017-12-17 ENCOUNTER — Emergency Department (HOSPITAL_COMMUNITY)
Admission: EM | Admit: 2017-12-17 | Discharge: 2017-12-17 | Disposition: A | Discharge status: Home / Self Care | Payer: Self-pay | Attending: Emergency Medicine | Admitting: Emergency Medicine

## 2017-12-17 ENCOUNTER — Encounter (HOSPITAL_COMMUNITY): Payer: Self-pay | Admitting: Emergency Medicine

## 2017-12-17 DIAGNOSIS — F17210 Nicotine dependence, cigarettes, uncomplicated: Secondary | ICD-10-CM | POA: Insufficient documentation

## 2017-12-17 DIAGNOSIS — K047 Periapical abscess without sinus: Secondary | ICD-10-CM | POA: Insufficient documentation

## 2017-12-17 MED ORDER — AMOXICILLIN 250 MG PO CAPS
500.0000 mg | ORAL_CAPSULE | Freq: Once | ORAL | Status: AC
  Administered 2017-12-17: 500 mg via ORAL
  Filled 2017-12-17: qty 2

## 2017-12-17 MED ORDER — ACETAMINOPHEN 325 MG PO TABS
ORAL_TABLET | ORAL | Status: AC
  Filled 2017-12-17: qty 2

## 2017-12-17 MED ORDER — ACETAMINOPHEN 325 MG PO TABS
650.0000 mg | ORAL_TABLET | Freq: Once | ORAL | Status: AC
  Administered 2017-12-17: 650 mg via ORAL

## 2017-12-17 MED ORDER — AMOXICILLIN 500 MG PO CAPS: 500.0000 mg | ORAL_CAPSULE | Freq: Three times a day (TID) | ORAL | 0 refills | Status: AC

## 2017-12-17 NOTE — ED Provider Notes (Signed)
[facility] EMERGENCY DEPARTMENT Provider Note   CSN: [id number] Arrival date & time: 12/17/17  1428     History   Chief Complaint Chief Complaint  Patient presents with  . Dental Problem    HPI Brenda Keller is a 32 y.o. female presenting with a day history of dental pain and gingival swelling.   The patient has a history of  decay in the tooth involved which has recently started to cause increased  Pain and swelling with spontaneous drainage of the abscess, now with improved pain.  There has been no fevers, chills, nausea or vomiting, also no complaint of difficulty swallowing, although chewing makes pain worse.  The patient has tried oragel without relief of symptoms.    .  The history is provided by the patient.    Past Medical History:  Diagnosis Date  . Depression     Patient Active Problem List   Diagnosis Date Noted  . Protein-calorie malnutrition, severe (HCC) 11/26/2014  . 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 Staphylococcus aureus bacteremia with sepsis (HCC) 11/25/2014  . Nicotine dependence 11/25/2014  . Sepsis (HCC) 11/23/2014  . Fever 11/23/2014  . Opiate dependence (HCC) 06/26/2013    Past Surgical History:  Procedure Laterality Date  . MOUTH SURGERY    . TEE WITHOUT CARDIOVERSION N/A 11/26/2014   Procedure: TRANSESOPHAGEAL ECHOCARDIOGRAM (TEE) WITH PROPOFOL;  Surgeon: Laqueta Linden, MD;  Location: [facility];  Service: Cardiology;  Laterality: N/A;     OB History    Gravida  2   Para  2   Term  2   Preterm      AB      Living  2     SAB      TAB      Ectopic      Multiple      Live Births               Home Medications    Prior to Admission medications   Medication Sig Start Date End Date Taking? Authorizing Provider  amoxicillin (AMOXIL) 500 MG capsule Take 1 capsule (500 mg total) by mouth 3 (three) times daily for 10 days. 12/17/17 12/27/17  Burgess Amor, PA-C  doxycycline (VIBRAMYCIN) 100 MG capsule Take 1  capsule (100 mg total) by mouth 2 (two) times daily. 09/21/17   Triplett, Tammy, PA-C  Etonogestrel (IMPLANON [place]) Inject into the skin.      [provider]  ibuprofen (ADVIL,MOTRIN) 600 MG tablet Take 1 tablet (600 mg total) by mouth every 6 (six) hours as needed. 09/12/17   Burgess Amor, PA-C  nitrofurantoin, macrocrystal-monohydrate, (MACROBID) 100 MG capsule Take 1 capsule (100 mg total) by mouth 2 (two) times daily. 02/15/16   Long, Arlyss Repress, MD  traMADol (ULTRAM) 50 MG tablet Take 1 tablet (50 mg total) by mouth every 6 (six) hours as needed. 09/12/17   Burgess Amor, PA-C    Family History Family History  Problem Relation Age of Onset  . Cancer Other     Social History Social History   Tobacco Use  . Smoking status: Current Every Day Smoker    Packs/day: 1.00    Years: 5.00    Pack years: 5.00    Types: Cigarettes  . Smokeless tobacco: Never Used  Substance Use Topics  . Alcohol use: No  . Drug use: Yes    Types: Oxycodone, IV    Comment: Oxycodone pills crushed up and injected through IV on a daily  basis .states quit 2-3 months ago.     Allergies   Coconut flavor and Hydrocodone   Review of Systems Review of Systems  Constitutional: Negative for fever.  HENT: Positive for dental problem. Negative for facial swelling and sore throat.   Respiratory: Negative for shortness of breath.   Musculoskeletal: Negative for neck pain and neck stiffness.     Physical Exam Updated Vital Signs BP 112/68 (BP Location: Right Arm)   Pulse 90   Temp 98.1 F (36.7 C) (Oral)   Resp 17   Ht 5\' 5"  (1.651 m)   Wt 53.1 kg (117 lb)   SpO2 98%   BMI 19.47 kg/m   Physical Exam  Constitutional: She is oriented to person, place, and time. She appears well-developed and well-nourished. No distress.  HENT:  Head: Normocephalic and atraumatic.  Right Ear: Tympanic membrane and external ear normal.  Left Ear: Tympanic membrane and external ear normal.  Mouth/Throat: Oropharynx is  clear and moist and mucous membranes are normal. No oral lesions. No trismus in the jaw. Dental abscesses present. No uvula swelling.    Gingival edema with erythema left lateral lower gingiva. Small draining abscess.  Eyes: Conjunctivae are normal.  Neck: Normal range of motion. Neck supple.  Cardiovascular: Normal rate and normal heart sounds.  Pulmonary/Chest: Effort normal.  Abdominal: She exhibits no distension.  Musculoskeletal: Normal range of motion.  Lymphadenopathy:    She has no cervical adenopathy.  Neurological: She is alert and oriented to person, place, and time.  Skin: Skin is warm and dry. No erythema.  Psychiatric: She has a normal mood and affect.  Nursing note and vitals reviewed.    ED Treatments / Results  Labs (all labs ordered are listed, but only abnormal results are displayed) Labs Reviewed - No data to display  EKG None  Radiology No results found.  Procedures Procedures (including critical care time)  Medications Ordered in ED Medications  amoxicillin (AMOXIL) capsule 500 mg (has no administration in time range)     Initial Impression / Assessment and Plan / ED Course  I have reviewed the triage vital signs and the nursing notes.  Pertinent labs & imaging results that were available during my care of the patient were reviewed by me and considered in my medical decision making (see chart for details).     Pt with dental abscess, draining. Dental referrals given, amoxil. Motrin recommended, continued orajel prn.   Final Clinical Impressions(s) / ED Diagnoses   Final diagnoses:  Dental abscess    ED Discharge Orders        Ordered    amoxicillin (AMOXIL) 500 MG capsule  3 times daily     12/17/17 1613       Burgess Amordol, Michale Emmerich, PA-C 12/17/17 1625    Mesner, Barbara CowerJason, MD 12/17/17 2315

## 2017-12-17 NOTE — ED Triage Notes (Signed)
Pt reports left lower dental abscess.  States it has been draining and making her nauseated.

## 2017-12-17 NOTE — ED Notes (Signed)
Pt encouraged to keep check on temperature and take motrin/tylenol for fever. Advised temp was 100.1

## 2017-12-17 NOTE — Discharge Instructions (Addendum)
Complete your entire course of antibiotics as prescribed.    Avoid applying heat or ice to this abscess area which can worsen your symptoms.  You may use warm salt water swish and spit treatment or half peroxide and water swish and spit after meals to keep this area clean as discussed.  Call the dentist listed above for further management of your symptoms.  

## 2018-01-05 ENCOUNTER — Emergency Department (HOSPITAL_COMMUNITY)
Admission: EM | Admit: 2018-01-05 | Discharge: 2018-01-05 | Disposition: A | Discharge status: Home / Self Care | Payer: Self-pay | Attending: Emergency Medicine | Admitting: Emergency Medicine

## 2018-01-05 ENCOUNTER — Emergency Department (HOSPITAL_COMMUNITY): Payer: Self-pay

## 2018-01-05 ENCOUNTER — Encounter (HOSPITAL_COMMUNITY): Payer: Self-pay | Admitting: Emergency Medicine

## 2018-01-05 DIAGNOSIS — Z79899 Other long term (current) drug therapy: Secondary | ICD-10-CM | POA: Insufficient documentation

## 2018-01-05 DIAGNOSIS — F17210 Nicotine dependence, cigarettes, uncomplicated: Secondary | ICD-10-CM | POA: Insufficient documentation

## 2018-01-05 DIAGNOSIS — M5417 Radiculopathy, lumbosacral region: Secondary | ICD-10-CM | POA: Insufficient documentation

## 2018-01-05 LAB — POC URINE PREG, ED: Preg Test, Ur: NEGATIVE

## 2018-01-05 MED ORDER — DEXAMETHASONE 10 MG/ML FOR PEDIATRIC ORAL USE
10.0000 mg | Freq: Once | INTRAMUSCULAR | Status: AC
  Administered 2018-01-05: 10 mg via ORAL
  Filled 2018-01-05: qty 1

## 2018-01-05 MED ORDER — ONDANSETRON HCL 4 MG/2ML IJ SOLN: 4.0000 mg | Freq: Once | INTRAMUSCULAR | Status: DC

## 2018-01-05 MED ORDER — KETOROLAC TROMETHAMINE 30 MG/ML IJ SOLN
30.0000 mg | Freq: Once | INTRAMUSCULAR | Status: AC
  Administered 2018-01-05: 30 mg via INTRAMUSCULAR
  Filled 2018-01-05: qty 1

## 2018-01-05 MED ORDER — KETOROLAC TROMETHAMINE 30 MG/ML IJ SOLN: 30.0000 mg | Freq: Once | INTRAMUSCULAR | Status: DC

## 2018-01-05 MED ORDER — ONDANSETRON 4 MG PO TBDP
4.0000 mg | ORAL_TABLET | Freq: Once | ORAL | Status: AC
  Administered 2018-01-05: 4 mg via ORAL
  Filled 2018-01-05: qty 1

## 2018-01-05 NOTE — Discharge Instructions (Addendum)
It is important to follow-up with your primary care provider for your visit today. Please return to the emergency department immediately if your pain persists or gets worse, if you develop a fever, nausea, vomiting, diarrhea, redness, warmth these may be signs of infection.    Contact a doctor if: You have pain that: Wakes you up when you are sleeping. Gets worse when you lie down. Is worse than the pain you have had in the past. Lasts longer than 4 weeks. You lose weight for without trying. Get help right away if: You cannot control when you pee (urinate) or poop (have a bowel movement). You have weakness in any of these areas and it gets worse. Lower back. Lower belly (pelvis). Butt (buttocks). Legs. You have redness or swelling of your back. You have a burning feeling when you pee. Trouble standing or rising from a sitting position. Fever lower than 102F (38.9C). Back stiffness. Flu-like symptoms, such as a sore throat and a runny nose. Irritability. Feeling pain when the affected area is touched.
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
## 2018-01-05 NOTE — ED Triage Notes (Signed)
Pt reports waking yesterday with lower back pain going down into right buttock and leg.
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
## 2018-01-09 ENCOUNTER — Emergency Department (HOSPITAL_COMMUNITY)
Admission: EM | Admit: 2018-01-09 | Discharge: 2018-01-09 | Disposition: A | Discharge status: Home / Self Care | Payer: Self-pay | Attending: Emergency Medicine | Admitting: Emergency Medicine

## 2018-01-09 ENCOUNTER — Other Ambulatory Visit: Payer: Self-pay

## 2018-01-09 ENCOUNTER — Encounter (HOSPITAL_COMMUNITY): Payer: Self-pay | Admitting: Emergency Medicine

## 2018-01-09 DIAGNOSIS — M5431 Sciatica, right side: Secondary | ICD-10-CM | POA: Insufficient documentation

## 2018-01-09 DIAGNOSIS — K0889 Other specified disorders of teeth and supporting structures: Secondary | ICD-10-CM | POA: Insufficient documentation

## 2018-01-09 DIAGNOSIS — Z79899 Other long term (current) drug therapy: Secondary | ICD-10-CM | POA: Insufficient documentation

## 2018-01-09 DIAGNOSIS — F17210 Nicotine dependence, cigarettes, uncomplicated: Secondary | ICD-10-CM | POA: Insufficient documentation

## 2018-01-09 MED ORDER — AMOXICILLIN 500 MG PO CAPS: 500.0000 mg | ORAL_CAPSULE | Freq: Three times a day (TID) | ORAL | 0 refills | Status: DC

## 2018-01-09 MED ORDER — CYCLOBENZAPRINE HCL 10 MG PO TABS: 10.0000 mg | ORAL_TABLET | Freq: Three times a day (TID) | ORAL | 0 refills | Status: DC | PRN

## 2018-01-09 MED ORDER — PREDNISONE 10 MG PO TABS: ORAL_TABLET | ORAL | 0 refills | Status: DC

## 2018-01-09 NOTE — ED Triage Notes (Signed)
Pt c/o of right back and leg pain.  Was seen Saturday.  Pain worsened today

## 2018-01-09 NOTE — ED Provider Notes (Signed)
[facility] EMERGENCY DEPARTMENT Provider Note   CSN: [id number] Arrival date & time: 01/09/18  1502     History   Chief Complaint Chief Complaint  Patient presents with  . Back Pain    HPI Shirley MuscatJeana N San is a 32 y.o. female.  HPI   Shirley MuscatJeana N Doukas is a 32 y.o. female who presents to the Emergency Department complaining of continued right sided low back and buttock pain.  She was seen here three days ago for same.  She describes a throbbing pain in her right buttock/lower back that radiates into her right leg to the level of her thigh.  Pain is worse with weight bearing and bending, improves at rest.  Her symptoms began after riding a go [place]art [place] the beach last week.  She reports some relief with Advil.  She denies abdominal pain, fever, chills, vomiting, urine or bowel changes, numbness or weakness of the lower extremities.  She also requests evaluation of pain to her left lower teeth and reports hx of recurrent dental infection due to decayed teeth.  No facial swelling, difficulty swallowing or neck pain.    Past Medical History:  Diagnosis Date  . Depression     Patient Active Problem List   Diagnosis Date Noted  . Protein-calorie malnutrition, severe (HCC) 11/26/2014  . 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 Staphylococcus aureus bacteremia with sepsis (HCC) 11/25/2014  . Nicotine dependence 11/25/2014  . Sepsis (HCC) 11/23/2014  . Fever 11/23/2014  . Opiate dependence (HCC) 06/26/2013    Past Surgical History:  Procedure Laterality Date  . MOUTH SURGERY    . TEE WITHOUT CARDIOVERSION N/A 11/26/2014   Procedure: TRANSESOPHAGEAL ECHOCARDIOGRAM (TEE) WITH PROPOFOL;  Surgeon: Laqueta LindenSuresh A Koneswaran, MD;  Location: [facility];  Service: Cardiology;  Laterality: N/A;     OB History    Gravida  2   Para  2   Term  2   Preterm      AB      Living  2     SAB      TAB      Ectopic      Multiple      Live Births               Home  Medications    Prior to Admission medications   Medication Sig Start Date End Date Taking? Authorizing Provider  doxycycline (VIBRAMYCIN) 100 MG capsule Take 1 capsule (100 mg total) by mouth 2 (two) times daily. 09/21/17   Toia Micale, PA-C  Etonogestrel (IMPLANON [place]) Inject into the skin.      [provider]  ibuprofen (ADVIL,MOTRIN) 600 MG tablet Take 1 tablet (600 mg total) by mouth every 6 (six) hours as needed. 09/12/17   Burgess AmorIdol, Julie, PA-C  traMADol (ULTRAM) 50 MG tablet Take 1 tablet (50 mg total) by mouth every 6 (six) hours as needed. 09/12/17   Burgess AmorIdol, Julie, PA-C    Family History Family History  Problem Relation Age of Onset  . Cancer Other     Social History Social History   Tobacco Use  . Smoking status: Current Every Day Smoker    Packs/day: 1.00    Years: 5.00    Pack years: 5.00    Types: Cigarettes  . Smokeless tobacco: Never Used  Substance Use Topics  . Alcohol use: No  . Drug use: Yes    Types: Oxycodone, IV    Comment: last used Feb 2019  Allergies   Coconut flavor and Hydrocodone   Review of Systems Review of Systems  Constitutional: Negative for activity change, appetite change and fever.  HENT: Positive for dental problem. Negative for facial swelling.   Respiratory: Negative for shortness of breath.   Gastrointestinal: Negative for abdominal pain and vomiting.  Genitourinary: Negative for decreased urine volume, difficulty urinating, dysuria and flank pain.  Musculoskeletal: Positive for back pain. Negative for joint swelling.  Skin: Negative for rash.  Neurological: Negative for weakness and numbness.  All other systems reviewed and are negative.    Physical Exam Updated Vital Signs BP 95/66 (BP Location: Right Arm)   Pulse 85   Temp 98 F (36.7 C) (Oral)   Resp 18   Ht 5\' 5"  (1.651 m)   Wt 53.1 kg (117 lb)   SpO2 100%   BMI 19.47 kg/m   Physical Exam  Constitutional: She is oriented to person, place, and time.  She appears well-developed and well-nourished. No distress.  HENT:  Head: Atraumatic.  Mouth/Throat: Uvula is midline, oropharynx is clear and moist and mucous membranes are normal. No trismus in the jaw. Dental caries present.  ttp and dental decay of the left lower premolars and first molar.  No erythema or fluctuance of the gingiva.    Neck: Normal range of motion. Neck supple.  Cardiovascular: Normal rate, regular rhythm and intact distal pulses.  DP pulses are strong and palpable bilaterally  Pulmonary/Chest: Effort normal and breath sounds normal. No respiratory distress.  Abdominal: Soft. She exhibits no distension. There is no tenderness.  Musculoskeletal: She exhibits tenderness. She exhibits no edema.       Lumbar back: She exhibits tenderness and pain. She exhibits normal range of motion, no swelling, no deformity, no laceration and normal pulse.  Focal ttp of the right SI joint space.   No spinal tenderness.  Pt has 5/5 strength against resistance of bilateral lower extremities.  Hip flexors and extensors intact   Neurological: She is alert and oriented to person, place, and time. She has normal strength. No sensory deficit. She exhibits normal muscle tone. Coordination and gait normal.  Reflex Scores:      Patellar reflexes are 2+ on the right side and 2+ on the left side.      Achilles reflexes are 2+ on the right side and 2+ on the left side. Skin: Skin is warm and dry. Capillary refill takes less than 2 seconds. No rash noted.  Nursing note and vitals reviewed.    ED Treatments / Results  Labs (all labs ordered are listed, but only abnormal results are displayed) Labs Reviewed - No data to display  EKG None  Radiology No results found.  Procedures Procedures (including critical care time)  Medications Ordered in ED Medications - No data to display   Initial Impression / Assessment and Plan / ED Course  I have reviewed the triage vital signs and the nursing  notes.  Pertinent labs & imaging results that were available during my care of the patient were reviewed by me and considered in my medical decision making (see chart for details).     Pt is well appearing.  Ambulatory with steady gait.  No focal neuro deficits.  Sx's likely related to sciatica.  Will prescribe steroid taper and muscle relaxer.  Given referral info for local clinic.  No concerning sx's for cauda equina or spinal abscess.  Doubt abdominal or pelvic process.  Dental decay w/o abscess or Ludwig's angina.  Return precautions discussed.   Final Clinical Impressions(s) / ED Diagnoses   Final diagnoses:  Sciatica of right side  Pain, dental    ED Discharge Orders    None       Pauline Aus, PA-C 01/09/18 1634    Bethann Berkshire, MD 01/09/18 1635

## 2018-01-09 NOTE — Discharge Instructions (Addendum)
Alternate ice and heat to your back.  Follow-up with one of the clinics listed.  Return here for any worsening symptoms

## 2018-01-21 ENCOUNTER — Other Ambulatory Visit: Payer: Self-pay

## 2018-01-21 ENCOUNTER — Encounter (HOSPITAL_COMMUNITY): Payer: Self-pay | Admitting: Emergency Medicine

## 2018-01-21 ENCOUNTER — Emergency Department (HOSPITAL_COMMUNITY): Payer: Self-pay

## 2018-01-21 ENCOUNTER — Emergency Department (HOSPITAL_COMMUNITY)
Admission: EM | Admit: 2018-01-21 | Discharge: 2018-01-21 | Disposition: A | Discharge status: Home / Self Care | Payer: Self-pay | Attending: Emergency Medicine | Admitting: Emergency Medicine

## 2018-01-21 DIAGNOSIS — Z79899 Other long term (current) drug therapy: Secondary | ICD-10-CM | POA: Insufficient documentation

## 2018-01-21 DIAGNOSIS — M5441 Lumbago with sciatica, right side: Secondary | ICD-10-CM | POA: Insufficient documentation

## 2018-01-21 DIAGNOSIS — R600 Localized edema: Secondary | ICD-10-CM | POA: Insufficient documentation

## 2018-01-21 DIAGNOSIS — F17210 Nicotine dependence, cigarettes, uncomplicated: Secondary | ICD-10-CM | POA: Insufficient documentation

## 2018-01-21 DIAGNOSIS — R609 Edema, unspecified: Secondary | ICD-10-CM

## 2018-01-21 LAB — CBC WITH DIFFERENTIAL/PLATELET
BASOS ABS: 0 10*3/uL (ref 0.0–0.1)
BASOS PCT: 0 %
Eosinophils Absolute: 0 10*3/uL (ref 0.0–0.7)
Eosinophils Relative: 0 %
HEMATOCRIT: 32.9 % — AB (ref 36.0–46.0)
HEMOGLOBIN: 10.6 g/dL — AB (ref 12.0–15.0)
LYMPHS PCT: 19 %
Lymphs Abs: 2.3 10*3/uL (ref 0.7–4.0)
MCH: 26.3 pg (ref 26.0–34.0)
MCHC: 32.2 g/dL (ref 30.0–36.0)
MCV: 81.6 fL (ref 78.0–100.0)
MONO ABS: 0.9 10*3/uL (ref 0.1–1.0)
MONOS PCT: 7 %
NEUTROS ABS: 8.6 10*3/uL — AB (ref 1.7–7.7)
NEUTROS PCT: 74 %
Platelets: 217 10*3/uL (ref 150–400)
RBC: 4.03 MIL/uL (ref 3.87–5.11)
RDW: 16.6 % — AB (ref 11.5–15.5)
WBC: 11.8 10*3/uL — ABNORMAL HIGH (ref 4.0–10.5)

## 2018-01-21 LAB — COMPREHENSIVE METABOLIC PANEL
ALBUMIN: 2.9 g/dL — AB (ref 3.5–5.0)
ALT: 22 U/L (ref 0–44)
ANION GAP: 7 (ref 5–15)
AST: 16 U/L (ref 15–41)
Alkaline Phosphatase: 146 U/L — ABNORMAL HIGH (ref 38–126)
BUN: 12 mg/dL (ref 6–20)
CO2: 28 mmol/L (ref 22–32)
Calcium: 8.9 mg/dL (ref 8.9–10.3)
Chloride: 99 mmol/L (ref 98–111)
Creatinine, Ser: 0.8 mg/dL (ref 0.44–1.00)
GFR calc non Af Amer: >60 mL/min (ref >60)
GLUCOSE: 77 mg/dL (ref 70–99)
POTASSIUM: 3.9 mmol/L (ref 3.5–5.1)
Sodium: 134 mmol/L — ABNORMAL LOW (ref 135–145)
TOTAL PROTEIN: 7.1 g/dL (ref 6.5–8.1)
Total Bilirubin: 0.4 mg/dL (ref 0.3–1.2)

## 2018-01-21 LAB — BRAIN NATRIURETIC PEPTIDE: B Natriuretic Peptide: 21 pg/mL (ref 0.0–100.0)

## 2018-01-21 LAB — D-DIMER, QUANTITATIVE (NOT AT ARMC): D DIMER QUANT: 1.25 ug{FEU}/mL — AB (ref 0.00–0.50)

## 2018-01-21 MED ORDER — PREDNISONE 50 MG PO TABS
60.0000 mg | ORAL_TABLET | Freq: Once | ORAL | Status: AC
  Administered 2018-01-21: 60 mg via ORAL
  Filled 2018-01-21: qty 1

## 2018-01-21 MED ORDER — PREDNISONE 20 MG PO TABS: 40.0000 mg | ORAL_TABLET | Freq: Every day | ORAL | 0 refills | Status: DC

## 2018-01-21 MED ORDER — DICLOFENAC EPOLAMINE 1.3 % TD PTCH
1.0000 | MEDICATED_PATCH | Freq: Two times a day (BID) | TRANSDERMAL | Status: DC
  Administered 2018-01-21: 1 via TRANSDERMAL
  Filled 2018-01-21 (×5): qty 1

## 2018-01-21 MED ORDER — LIDOCAINE 5 % EX PTCH: 1.0000 | MEDICATED_PATCH | CUTANEOUS | 0 refills | Status: DC
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
## 2018-01-21 NOTE — Discharge Instructions (Signed)
As discussed, your evaluation today has been largely reassuring.  But, it is important that you monitor your condition carefully, and do not hesitate to return to the ED if you develop new, or concerning changes in your condition. ? ?Otherwise, please follow-up with your physician for appropriate ongoing care. ? ?

## 2018-01-21 NOTE — ED Triage Notes (Signed)
PT c/o lower back pain worsening over the past week. PT also c/o bilateral lower leg swelling worsening x5 days.

## 2018-01-21 NOTE — ED Notes (Signed)
Pt up ambulating to BR.

## 2018-01-21 NOTE — ED Notes (Signed)
Pt transported to US

## 2018-02-03 ENCOUNTER — Encounter (HOSPITAL_COMMUNITY): Payer: Self-pay | Admitting: *Deleted

## 2018-02-03 ENCOUNTER — Emergency Department (HOSPITAL_COMMUNITY)
Admission: EM | Admit: 2018-02-03 | Discharge: 2018-02-03 | Disposition: A | Discharge status: Home / Self Care | Payer: Self-pay | Attending: Emergency Medicine | Admitting: Emergency Medicine

## 2018-02-03 ENCOUNTER — Other Ambulatory Visit: Payer: Self-pay

## 2018-02-03 ENCOUNTER — Emergency Department (HOSPITAL_COMMUNITY): Payer: Self-pay

## 2018-02-03 DIAGNOSIS — R05 Cough: Secondary | ICD-10-CM | POA: Insufficient documentation

## 2018-02-03 DIAGNOSIS — Z79899 Other long term (current) drug therapy: Secondary | ICD-10-CM | POA: Insufficient documentation

## 2018-02-03 DIAGNOSIS — F17210 Nicotine dependence, cigarettes, uncomplicated: Secondary | ICD-10-CM | POA: Insufficient documentation

## 2018-02-03 DIAGNOSIS — M5431 Sciatica, right side: Secondary | ICD-10-CM | POA: Insufficient documentation

## 2018-02-03 DIAGNOSIS — R059 Cough, unspecified: Secondary | ICD-10-CM

## 2018-02-03 MED ORDER — CYCLOBENZAPRINE HCL 5 MG PO TABS: 5.0000 mg | ORAL_TABLET | Freq: Three times a day (TID) | ORAL | 0 refills | Status: DC | PRN

## 2018-02-03 MED ORDER — NAPROXEN 500 MG PO TABS: 500.0000 mg | ORAL_TABLET | Freq: Two times a day (BID) | ORAL | 0 refills | Status: DC
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
## 2018-02-03 NOTE — Discharge Instructions (Addendum)
You may use the medications prescribed for pain relief.  In addition you may benefit from a heating pad applied 20 minutes several times daily.  As discussed, you may want to consider picking up [place] brand lidocaine patches which is similar to the prescription you have had from here previously.  This medication should be less expensive the prescription.  Plan to follow-up with your new provider in [place] as you are arranging.  I understand you do not want to wait for your chest x-ray today.  However, return here or get seen somewhere if you have any worsening cough, shortness of breath or fevers.
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
## 2018-04-26 ENCOUNTER — Emergency Department (HOSPITAL_COMMUNITY)
Admission: EM | Admit: 2018-04-26 | Discharge: 2018-04-27 | Disposition: A | Discharge status: Home / Self Care | Payer: Self-pay | Attending: Emergency Medicine | Admitting: Emergency Medicine

## 2018-04-26 ENCOUNTER — Encounter (HOSPITAL_COMMUNITY): Payer: Self-pay | Admitting: Emergency Medicine

## 2018-04-26 DIAGNOSIS — L089 Local infection of the skin and subcutaneous tissue, unspecified: Secondary | ICD-10-CM | POA: Insufficient documentation

## 2018-04-26 DIAGNOSIS — F17210 Nicotine dependence, cigarettes, uncomplicated: Secondary | ICD-10-CM | POA: Insufficient documentation

## 2018-04-26 DIAGNOSIS — Z79899 Other long term (current) drug therapy: Secondary | ICD-10-CM | POA: Insufficient documentation

## 2018-04-26 MED ORDER — DOXYCYCLINE HYCLATE 100 MG PO TABS
100.0000 mg | ORAL_TABLET | Freq: Once | ORAL | Status: AC
  Administered 2018-04-27: 100 mg via ORAL
  Filled 2018-04-26: qty 1

## 2018-04-26 MED ORDER — IBUPROFEN 400 MG PO TABS
400.0000 mg | ORAL_TABLET | Freq: Once | ORAL | Status: AC
  Administered 2018-04-27: 400 mg via ORAL
  Filled 2018-04-26: qty 1

## 2018-04-26 MED ORDER — CEPHALEXIN 500 MG PO CAPS
500.0000 mg | ORAL_CAPSULE | Freq: Once | ORAL | Status: AC
  Administered 2018-04-27: 500 mg via ORAL
  Filled 2018-04-26: qty 1

## 2018-04-26 MED ORDER — HYDROXYZINE HCL 25 MG PO TABS
25.0000 mg | ORAL_TABLET | Freq: Once | ORAL | Status: AC
  Administered 2018-04-27: 25 mg via ORAL
  Filled 2018-04-26: qty 1

## 2018-04-26 NOTE — ED Triage Notes (Signed)
Patient has rash to face and arms, swelling around lips, no shortness of breath or difficulty breathing.

## 2018-04-26 NOTE — ED Provider Notes (Signed)
[facility] EMERGENCY DEPARTMENT Provider Note   CSN: [id number] Arrival date & time: 04/26/18  2214     History   Chief Complaint Chief Complaint  Patient presents with  . Rash    HPI Brenda Keller is a 32 y.o. female.  Patient is a 32 year old female who presents to the emergency department with a complaint of rash to the face and arms.  Patient states that she has been developing zits and sores on her face and on her arms.  She says she has been popping the heads off of them, and pulling scabs off of others.  She says she was digging into some of them on last night and the night before.  Today she woke up and noticed that she had swelling around her lip, increased redness of her face, increased redness of her left and right arm.  She has not had fever or chills.  There is been no nausea vomiting reported.  The patient denies self-mutilation, she says she has some bouts with depression but she denies self-mutilation.  No suicidal homicidal ideations.  The history is provided by the patient.  Rash   This is a new problem.    Past Medical History:  Diagnosis Date  . Depression     Patient Active Problem List   Diagnosis Date Noted  . Protein-calorie malnutrition, severe (HCC) 11/26/2014  . 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 Staphylococcus aureus bacteremia with sepsis (HCC) 11/25/2014  . Nicotine dependence 11/25/2014  . Sepsis (HCC) 11/23/2014  . Fever 11/23/2014  . Opiate dependence (HCC) 06/26/2013    Past Surgical History:  Procedure Laterality Date  . MOUTH SURGERY    . TEE WITHOUT CARDIOVERSION N/A 11/26/2014   Procedure: TRANSESOPHAGEAL ECHOCARDIOGRAM (TEE) WITH PROPOFOL;  Surgeon: Laqueta Linden, MD;  Location: [facility];  Service: Cardiology;  Laterality: N/A;     OB History    Gravida  2   Para  2   Term  2   Preterm      AB      Living  2     SAB      TAB      Ectopic      Multiple      Live Births                Home Medications    Prior to Admission medications   Medication Sig Start Date End Date Taking? Authorizing Provider  amoxicillin (AMOXIL) 500 MG capsule Take 1 capsule (500 mg total) by mouth 3 (three) times daily. Patient not taking: Reported on 01/21/2018 01/09/18   Pauline Aus, PA-C  cyclobenzaprine (FLEXERIL) 5 MG tablet Take 1 tablet (5 mg total) by mouth 3 (three) times daily as needed for muscle spasms. 02/03/18   Burgess Amor, PA-C  Etonogestrel (IMPLANON [place]) Inject into the skin once.     [provider]  ibuprofen (ADVIL,MOTRIN) 200 MG tablet Take 400 mg by mouth every 6 (six) hours as needed for mild pain or moderate pain.    [provider]  lidocaine (LIDODERM) 5 % Place 1 patch onto the skin daily. Remove & Discard patch within 12 hours or as directed by MD 01/21/18   Gerhard Munch, MD  naproxen (NAPROSYN) 500 MG tablet Take 1 tablet (500 mg total) by mouth 2 (two) times daily. 02/03/18   Burgess Amor, PA-C  predniSONE (DELTASONE) 20 MG tablet Take 2 tablets (40 mg total) by mouth daily with breakfast. For the  next four days 01/21/18   Gerhard Munch, MD    Family History Family History  Problem Relation Age of Onset  . Cancer Other     Social History Social History   Tobacco Use  . Smoking status: Current Every Day Smoker    Packs/day: 1.00    Years: 5.00    Pack years: 5.00    Types: Cigarettes  . Smokeless tobacco: Never Used  Substance Use Topics  . Alcohol use: No  . Drug use: Not Currently    Types: Oxycodone, IV    Comment: last used Feb 2019     Allergies   Coconut flavor and Hydrocodone   Review of Systems Review of Systems  Constitutional: Negative for activity change.       All ROS Neg except as noted in HPI  HENT: Positive for facial swelling. Negative for nosebleeds.   Eyes: Negative for photophobia and discharge.  Respiratory: Negative for cough, shortness of breath and wheezing.   Cardiovascular: Negative for chest  pain and palpitations.  Gastrointestinal: Negative for abdominal pain and blood in stool.  Genitourinary: Negative for dysuria, frequency and hematuria.  Musculoskeletal: Negative for arthralgias, back pain and neck pain.  Skin: Positive for rash.  Neurological: Negative for dizziness, seizures and speech difficulty.  Psychiatric/Behavioral: Negative for confusion and hallucinations. The patient is nervous/anxious.      Physical Exam Updated Vital Signs BP 101/78 (BP Location: Right Arm)   Pulse 90   Temp 97.6 F (36.4 C) (Oral)   Resp 18   Ht 5\' 5"  (1.651 m)   Wt 49.9 kg   SpO2 100%   BMI 18.30 kg/m   Physical Exam  Constitutional: She is oriented to person, place, and time. She appears well-developed and well-nourished.  Non-toxic appearance.  HENT:  Head: Normocephalic.  Right Ear: Tympanic membrane and external ear normal.  Left Ear: Tympanic membrane and external ear normal.  Mild swelling around lips Patient has multiple open wounds particularly to the left side of the face and around the lips.  There is some mild purulent drainage in the area at the corner of the mouth near 1 of the lesions.  No red streaks appreciated.  The face is not hot to touch, however there is tenderness and soreness present.    Eyes: Pupils are equal, round, and reactive to light. EOM and lids are normal.  Neck: Normal range of motion. Neck supple. Carotid bruit is not present.  Cardiovascular: Normal rate, regular rhythm, normal heart sounds, intact distal pulses and normal pulses.  Pulmonary/Chest: Breath sounds normal. No respiratory distress.  Abdominal: Soft. Bowel sounds are normal. There is no tenderness. There is no guarding.  Musculoskeletal: Normal range of motion.  Lymphadenopathy:       Head (right side): No submandibular adenopathy present.       Head (left side): No submandibular adenopathy present.    She has no cervical adenopathy.  Neurological: She is alert and oriented to  person, place, and time. She has normal strength. No cranial nerve deficit or sensory deficit.  Skin: Skin is warm and dry.  Rash to arms and face. There are multiple open lesions at the dorsum of the right and left forearm and hand.  The area is not hot.  There are no red streaks appreciated.  No active drainage at this time.  Psychiatric: She has a normal mood and affect. Her speech is normal.  Nursing note and vitals reviewed.    ED Treatments /  Results  Labs (all labs ordered are listed, but only abnormal results are displayed) Labs Reviewed - No data to display  EKG None  Radiology No results found.  Procedures Procedures (including critical care time)  Medications Ordered in ED Medications - No data to display   Initial Impression / Assessment and Plan / ED Course  I have reviewed the triage vital signs and the nursing notes.  Pertinent labs & imaging results that were available during my care of the patient were reviewed by me and considered in my medical decision making (see chart for details).      Final Clinical Impressions(s) / ED Diagnoses MDM  Vital signs reviewed.  Pulse oximetry is 100% on room air.  Within normal limits by my interpretation.  Patient has been picking at sores and scabs on her face and on her arms.  The patient denies self-mutilation, and she denies suicidal or homicidal ideations.  The patient now has a secondary infection.  The patient will be treated with Keflex and Bactrim.  She will use Vistaril for itching.  The patient is to see her primary physician or return to the emergency department if this is not improving, or symptoms worsen.  Patient is in agreement with this plan.  I have strongly warned the patient about the danger of picking at scabs, popping zits, and digging at her sores.  Patient acknowledges understanding of this issue.   Final diagnoses:  Infection of skin and subcutaneous tissue    ED Discharge Orders         Ordered     hydrOXYzine (VISTARIL) 25 MG capsule  3 times daily PRN     04/27/18 0001    sulfamethoxazole-trimethoprim (BACTRIM DS,SEPTRA DS) 800-160 MG tablet  2 times daily     04/27/18 0001    cephALEXin (KEFLEX) 500 MG capsule  4 times daily     04/27/18 0001           Ivery Quale, PA-C 04/27/18 0012    Bethann Berkshire, MD 04/27/18 2252

## 2018-04-27 MED ORDER — CEPHALEXIN 500 MG PO CAPS: 500.0000 mg | ORAL_CAPSULE | Freq: Four times a day (QID) | ORAL | 0 refills | Status: DC

## 2018-04-27 MED ORDER — HYDROXYZINE PAMOATE 25 MG PO CAPS: 25.0000 mg | ORAL_CAPSULE | Freq: Three times a day (TID) | ORAL | 0 refills | Status: DC | PRN

## 2018-04-27 MED ORDER — SULFAMETHOXAZOLE-TRIMETHOPRIM 800-160 MG PO TABS: 1.0000 | ORAL_TABLET | Freq: Two times a day (BID) | ORAL | 0 refills | Status: AC

## 2018-04-27 NOTE — Discharge Instructions (Signed)
Your examination shows multiple areas of skin infection, particularly of the face, and both upper extremities.  It is extremely important that you stop digging at your sores, as well as stop pulling scabs and popping pimples.  Please cleanse the areas on your face as well as on your arms gently with soap and water.  Apply Neosporin or Polysporin ointment to these areas.  Please use Keflex with breakfast, lunch, dinner, and at bedtime.  Please use Bactrim 2 times daily with food.  Use Vistaril 3 times daily to prevent itching.  Vistaril may also cause drowsiness, please do not drive a vehicle, operate machinery, drink alcohol, or participate in activities requiring concentration when taking this medication.

## 2018-09-09 ENCOUNTER — Telehealth: Payer: Self-pay | Admitting: Physician Assistant

## 2018-09-12 ENCOUNTER — Ambulatory Visit: Payer: Self-pay | Admitting: Physician Assistant

## 2018-09-12 ENCOUNTER — Encounter: Payer: Self-pay | Admitting: Physician Assistant

## 2018-09-12 VITALS — BP 112/61 | HR 62 | Temp 97.9°F | Ht 64.0 in | Wt 128.5 lb

## 2018-09-12 DIAGNOSIS — Z862 Personal history of diseases of the blood and blood-forming organs and certain disorders involving the immune mechanism: Secondary | ICD-10-CM

## 2018-09-12 DIAGNOSIS — Z8679 Personal history of other diseases of the circulatory system: Secondary | ICD-10-CM

## 2018-09-12 DIAGNOSIS — Z13220 Encounter for screening for lipoid disorders: Secondary | ICD-10-CM

## 2018-09-12 DIAGNOSIS — R7989 Other specified abnormal findings of blood chemistry: Secondary | ICD-10-CM

## 2018-09-12 DIAGNOSIS — R945 Abnormal results of liver function studies: Secondary | ICD-10-CM

## 2018-09-12 DIAGNOSIS — F1910 Other psychoactive substance abuse, uncomplicated: Secondary | ICD-10-CM

## 2018-09-12 DIAGNOSIS — Z7689 Persons encountering health services in other specified circumstances: Secondary | ICD-10-CM

## 2018-09-12 DIAGNOSIS — F17200 Nicotine dependence, unspecified, uncomplicated: Secondary | ICD-10-CM

## 2018-09-12 NOTE — Progress Notes (Signed)
BP 112/61 (BP Location: Right Arm, Patient Position: Sitting, Cuff Size: Normal)   Pulse 62   Temp 97.9 F (36.6 C)   Ht 5\' 4"  (1.626 m)   Wt 128 lb 8 oz (58.3 kg)   SpO2 92%   BMI 22.06 kg/m    Subjective:    Patient ID: Brenda Keller, female    DOB: November 06, 1985, 33 y.o.   MRN: [id number]  HPI: Brenda Keller is a 33 y.o. female presenting on 09/12/2018 for New Patient (Initial Visit) (currently going to [facility] in [place] for bahavioral health. previous patient [facility]. 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    Pt says she is doing well at this time.   Relevant past medical, surgical, family and social history reviewed and updated as indicated. Interim medical history since our last visit reviewed. Allergies and medications reviewed and updated.    Current Outpatient Medications:  .  Etonogestrel (IMPLANON [place]), Inject into the skin once. , Disp: , Rfl:  .  METHADONE HCL PO, Take 100 mg by mouth daily., Disp: , Rfl:     Review of Systems  Constitutional: Positive for appetite change and unexpected weight change. Negative for chills, diaphoresis, fatigue and fever.  HENT: Positive for dental problem, ear pain, hearing loss, sneezing and voice change. Negative for congestion, drooling, facial swelling, mouth sores, sore throat and trouble swallowing.   Eyes: Negative for pain, discharge, redness, itching and visual disturbance.  Respiratory: Positive for wheezing. Negative for cough, choking and shortness of breath.   Cardiovascular: Negative for chest pain, palpitations and leg swelling.  Gastrointestinal: Negative for abdominal pain, blood in stool, constipation, diarrhea and vomiting.  Endocrine: Negative for cold intolerance, heat intolerance and polydipsia.  Genitourinary: Negative  for decreased urine volume, dysuria and hematuria.  Musculoskeletal: Positive for back pain. Negative for arthralgias and gait problem.  Skin: Negative for rash.  Allergic/Immunologic: Negative for environmental allergies.  Neurological: Negative for seizures, syncope, light-headedness and headaches.  Hematological: Negative for adenopathy.  Psychiatric/Behavioral: Positive for dysphoric mood. Negative for agitation and suicidal ideas. The patient is nervous/anxious.     Per HPI unless specifically indicated above     Objective:    BP 112/61 (BP Location: Right Arm, Patient Position: Sitting, Cuff Size: Normal)   Pulse 62   Temp 97.9 F (36.6 C)   Ht 5\' 4"  (1.626 m)   Wt 128 lb 8 oz (58.3 kg)   SpO2 92%   BMI 22.06 kg/m   Wt Readings from Last 3 Encounters:  09/12/18 128 lb 8 oz (58.3 kg)  04/26/18 110 lb (49.9 kg)  02/03/18 115 lb (52.2 kg)    Physical Exam Vitals signs reviewed.  Constitutional:      Appearance: She is well-developed.  HENT:     Head: Normocephalic and atraumatic.     Mouth/Throat:     Pharynx: No oropharyngeal exudate.  Eyes:     Conjunctiva/sclera: Conjunctivae normal.     Pupils: Pupils are equal, round, and reactive to light.  Neck:     Musculoskeletal: Neck supple.     Thyroid: No thyromegaly.  Cardiovascular:     Rate and Rhythm: Normal rate and regular rhythm.  Pulmonary:     Effort: Pulmonary effort is normal.  Breath sounds: Normal breath sounds.  Abdominal:     General: Bowel sounds are normal.     Palpations: Abdomen is soft. There is no mass.     Tenderness: There is no abdominal tenderness.  Lymphadenopathy:     Cervical: No cervical adenopathy.  Skin:    General: Skin is warm and dry.  Neurological:     Mental Status: She is alert and oriented to person, place, and time.     Gait: Gait normal.  Psychiatric:        Mood and Affect: Mood is anxious.        Behavior: Behavior normal.     EKG- NSR at 67 bpm-  No ST-T  changes.  Previous EKD from 2016 was at a rate of 133.       Assessment & Plan:   Encounter Diagnoses  Name Primary?  . Encounter to establish care Yes  . Elevated LFTs   . History of anemia   . History of endocarditis   . Substance abuse (HCC)   . Screening cholesterol level   . Tobacco use disorder     -will Check labs-  Lft's, cbc, chol -Will get trish to call pt about [facility] -encouraged pt to go to [facility] for anxiety/counseling -pt to follow up 1 month.  RTO sooner prn

## 2018-09-13 ENCOUNTER — Telehealth: Payer: Self-pay

## 2018-09-13 NOTE — Telephone Encounter (Signed)
Attempted to contact client for follow up for Case Management with [facility] after her appointment at [facility]. Attempted both numbers listed and no answer and neither number has voicemail set up. Will attempt at a later time.    Francee Nodal RN

## 2018-09-16 ENCOUNTER — Telehealth: Payer: Self-pay

## 2018-09-16 NOTE — Telephone Encounter (Signed)
Attempted to call client to follow up for Case management for [facility].  Was also asked by provider at [facility] PA-C to follow up with client with assistance in a referral to the [facility] for possible assistance with an eye exam/glasses. Called both numbers listed. No answer and no voicemail set up for either number, unable to leave a message.  Will continue to attempt to contact client.  Francee Nodal RN

## 2018-09-24 ENCOUNTER — Encounter: Payer: Self-pay | Admitting: Physician Assistant

## 2018-09-24 ENCOUNTER — Ambulatory Visit: Payer: Self-pay | Admitting: Physician Assistant

## 2018-09-24 VITALS — BP 90/60 | HR 107 | Temp 97.7°F | Ht 64.0 in

## 2018-09-24 DIAGNOSIS — R197 Diarrhea, unspecified: Secondary | ICD-10-CM

## 2018-09-24 DIAGNOSIS — R112 Nausea with vomiting, unspecified: Secondary | ICD-10-CM

## 2018-09-24 MED ORDER — PROMETHAZINE HCL 25 MG PO TABS: 25.0000 mg | ORAL_TABLET | Freq: Three times a day (TID) | ORAL | 0 refills | Status: AC | PRN

## 2018-09-24 NOTE — Progress Notes (Signed)
BP 90/60 (BP Location: Left Arm, Patient Position: Sitting, Cuff Size: Normal)   Pulse (!) 107   Temp 97.7 F (36.5 C)   Ht 5\' 4"  (1.626 m)   SpO2 98%   BMI 22.06 kg/m    Subjective:    Patient ID: Brenda Keller, female    DOB: 10-Apr-1986, 33 y.o.   MRN: [id number]  HPI: Brenda Keller is a 33 y.o. female presenting on 09/24/2018 for Nausea (began Saturday night. vomitting.and diarrhea since saturday. pt states last emesis episode was yesterday pt states she feels nauseous. pt states last diarrhea was about 15  minutes ago. pt states has taken nyquil which she feels helps. pt states no fever.)   HPI   Pt states no antibiotics since October.  No travel outside of the state or camping.  Pt states her significant other that she lives with was in the hospital last week with GI symptoms and then with the flu.    Pt has had no fever.  Relevant past medical, surgical, family and social history reviewed and updated as indicated. Interim medical history since our last visit reviewed. Allergies and medications reviewed and updated.   Current Outpatient Medications:  .  Etonogestrel (IMPLANON [place]), Inject into the skin once. , Disp: , Rfl:  .  METHADONE HCL PO, Take 100 mg by mouth daily., Disp: , Rfl:    Review of Systems  Per HPI unless specifically indicated above     Objective:    BP 90/60 (BP Location: Left Arm, Patient Position: Sitting, Cuff Size: Normal)   Pulse (!) 107   Temp 97.7 F (36.5 C)   Ht 5\' 4"  (1.626 m)   SpO2 98%   BMI 22.06 kg/m   Wt Readings from Last 3 Encounters:  09/12/18 128 lb 8 oz (58.3 kg)  04/26/18 110 lb (49.9 kg)  02/03/18 115 lb (52.2 kg)    Physical Exam Vitals signs reviewed.  Constitutional:      Appearance: She is well-developed and normal weight.     Comments: Pt is alert and perky and energetic and looks to be in no way feeling bad  HENT:     Head: Normocephalic and atraumatic.  Neck:     Musculoskeletal: Neck supple.   Cardiovascular:     Rate and Rhythm: Normal rate and regular rhythm.  Pulmonary:     Effort: Pulmonary effort is normal.     Breath sounds: Normal breath sounds.  Abdominal:     General: Bowel sounds are normal.     Palpations: Abdomen is soft. There is no mass.     Tenderness: There is no abdominal tenderness.  Lymphadenopathy:     Cervical: No cervical adenopathy.  Skin:    General: Skin is warm and dry.  Neurological:     Mental Status: She is alert and oriented to person, place, and time.  Psychiatric:        Behavior: Behavior normal. Behavior is cooperative.         Assessment & Plan:    Encounter Diagnoses  Name Primary?  . Non-intractable vomiting with nausea, unspecified vomiting type Yes  . Diarrhea, unspecified type     -pt counseled on fluid intake to prevent dehydration -she is given rx phenergan and counseled to avoid driving on it due to sedation -pt reminded to get baseline labs as were ordered 2/27  -pt to follow up as scheduled.  She is to RTO sooner prn worsening or new symptoms

## 2018-09-24 NOTE — Patient Instructions (Signed)
Vomiting, Adult Vomiting occurs when stomach contents are thrown up and out of the mouth. Many people notice nausea before vomiting. Vomiting can make you feel weak and cause you to become dehydrated. Dehydration can make you feel tired and thirsty, cause you to have a dry mouth, and decrease how often you urinate. Older adults and people who have other diseases or a weak body defense system (immune system) are at higher risk for dehydration. It is important to treat vomiting as told by your health care provider. Follow these instructions at home:  Eating and drinking     Follow these recommendations as told by your health care provider:  Take an oral rehydration solution (ORS). This is a drink that is sold at pharmacies and retail stores.  Eat bland, easy-to-digest foods in small amounts as you are able. These foods include bananas, applesauce, rice, lean meats, toast, and crackers.  Drink clear fluids slowly and in small amounts as you are able. Clear fluids include water, ice chips, low-calorie sports drinks, and fruit juice that has water added (diluted fruit juice).  Avoid drinking fluids that contain a lot of sugar or caffeine, such as energy drinks, sports drinks, and soda.  Avoid alcohol.  Avoid spicy or fatty foods.  General instructions  Wash your hands often using soap and water. If soap and water are not available, use hand sanitizer. Make sure that everyone in your household washes their hands frequently.  Take over-the-counter and prescription medicines only as told by your health care provider.  Rest at home while you recover.  Watch your condition for any changes.  Keep all follow-up visits as told by your health care provider. This is important. Contact a health care provider if:  Your vomiting gets worse.  You have new symptoms.  You have a fever.  You cannot drink fluids without vomiting.  You feel light-headed or dizzy.  You have a headache.  You  have muscle cramps.  You have a rash.  You have pain while urinating. Get help right away if:  You have pain in your chest, neck, arm, or jaw.  You feel extremely weak or you faint.  You have persistent vomiting.  You have vomit that is bright red or looks like black coffee grounds.  You have stools that are bloody or black, or stools that look like tar.  You have a severe headache, a stiff neck, or both.  You have severe pain, cramping, or bloating in your abdomen.  You have trouble breathing or you are breathing very quickly.  Your heart is beating very quickly.  Your skin feels cold and clammy.  You feel confused.  You have signs of dehydration, such as: ? Dark urine, very little urine, or no urine. ? Cracked lips. ? Dry mouth. ? Sunken eyes. ? Sleepiness. ? Weakness. These symptoms may represent a serious problem that is an emergency. Do not wait to see if the symptoms will go away. Get medical help right away. Call your local emergency services (911 in the [place] Do not drive yourself to the hospital. Summary  Vomiting occurs when stomach contents are thrown up and out of the mouth. Vomiting can cause you to become dehydrated. Older adults and people who have other diseases or a weak immune system are at higher risk for dehydration.  It is important to treat vomiting as told by your health care provider. Follow your health care provider's instructions about eating and drinking.  Wash your hands often using   soap and water. If soap and water are not available, use hand sanitizer. Make sure that everyone in your household washes their hands frequently.  Watch your condition for any changes and for signs of dehydration.  Keep all follow-up visits as told by your health care provider. This is important. This information is not intended to replace advice given to you by your health care provider. Make sure you discuss any questions you have with your health care  provider. Document Released: 07/30/2015 Document Revised: 12/11/2017 Document Reviewed: 12/11/2017 Elsevier Interactive Patient Education  2019 Elsevier Inc.  

## 2018-10-01 ENCOUNTER — Other Ambulatory Visit (HOSPITAL_COMMUNITY)
Admission: RE | Admit: 2018-10-01 | Discharge: 2018-10-01 | Disposition: A | Discharge status: Home / Self Care | Payer: Self-pay | Source: Ambulatory Visit | Attending: Physician Assistant | Admitting: Physician Assistant

## 2018-10-01 ENCOUNTER — Encounter: Payer: Self-pay | Admitting: Physician Assistant

## 2018-10-01 ENCOUNTER — Ambulatory Visit: Payer: Self-pay | Admitting: Physician Assistant

## 2018-10-01 ENCOUNTER — Other Ambulatory Visit: Payer: Self-pay

## 2018-10-01 VITALS — BP 106/60 | HR 79 | Temp 97.5°F

## 2018-10-01 DIAGNOSIS — R7989 Other specified abnormal findings of blood chemistry: Secondary | ICD-10-CM

## 2018-10-01 DIAGNOSIS — Z13220 Encounter for screening for lipoid disorders: Secondary | ICD-10-CM | POA: Insufficient documentation

## 2018-10-01 DIAGNOSIS — F17200 Nicotine dependence, unspecified, uncomplicated: Secondary | ICD-10-CM

## 2018-10-01 DIAGNOSIS — F39 Unspecified mood [affective] disorder: Secondary | ICD-10-CM

## 2018-10-01 DIAGNOSIS — F1910 Other psychoactive substance abuse, uncomplicated: Secondary | ICD-10-CM | POA: Insufficient documentation

## 2018-10-01 DIAGNOSIS — Z862 Personal history of diseases of the blood and blood-forming organs and certain disorders involving the immune mechanism: Secondary | ICD-10-CM | POA: Insufficient documentation

## 2018-10-01 DIAGNOSIS — R945 Abnormal results of liver function studies: Secondary | ICD-10-CM | POA: Insufficient documentation

## 2018-10-01 LAB — CBC
HCT: 38.5 % (ref 36.0–46.0)
Hemoglobin: 12.7 g/dL (ref 12.0–15.0)
MCH: 28.7 pg (ref 26.0–34.0)
MCHC: 33 g/dL (ref 30.0–36.0)
MCV: 87.1 fL (ref 80.0–100.0)
Platelets: 264 10*3/uL (ref 150–400)
RBC: 4.42 MIL/uL (ref 3.87–5.11)
RDW: 13.3 % (ref 11.5–15.5)
WBC: 5.6 10*3/uL (ref 4.0–10.5)
nRBC: 0 % (ref 0.0–0.2)

## 2018-10-01 LAB — COMPREHENSIVE METABOLIC PANEL
ALBUMIN: 3.6 g/dL (ref 3.5–5.0)
ALT: 18 U/L (ref 0–44)
AST: 23 U/L (ref 15–41)
Alkaline Phosphatase: 98 U/L (ref 38–126)
Anion gap: 6 (ref 5–15)
BUN: 10 mg/dL (ref 6–20)
CO2: 27 mmol/L (ref 22–32)
Calcium: 9.1 mg/dL (ref 8.9–10.3)
Chloride: 106 mmol/L (ref 98–111)
Creatinine, Ser: 0.52 mg/dL (ref 0.44–1.00)
GFR calc Af Amer: >60 mL/min (ref >60)
GFR calc non Af Amer: >60 mL/min (ref >60)
GLUCOSE: 86 mg/dL (ref 70–99)
Potassium: 4.1 mmol/L (ref 3.5–5.1)
Sodium: 139 mmol/L (ref 135–145)
Total Bilirubin: 0.3 mg/dL (ref 0.3–1.2)
Total Protein: 7.4 g/dL (ref 6.5–8.1)

## 2018-10-01 LAB — LIPID PANEL
Cholesterol: 131 mg/dL (ref 0–200)
HDL: 38 mg/dL — ABNORMAL LOW (ref >40)
LDL Cholesterol: 79 mg/dL (ref 0–99)
Total CHOL/HDL Ratio: 3.4 RATIO
Triglycerides: 70 mg/dL (ref <150)
VLDL: 14 mg/dL (ref 0–40)

## 2018-10-01 NOTE — Progress Notes (Signed)
BP 106/60   Pulse 79   Temp (!) 97.5 F (36.4 C)   SpO2 94%    Subjective:    Patient ID: Brenda Keller, female    DOB: 09/27/1985, 33 y.o.   MRN: [id number]  HPI: Brenda Keller is a 33 y.o. female presenting on 10/01/2018 for Follow-up   HPI Pt is feeling better today and has no complaints  She has not yet contacted [facility] for MH issues  Relevant past medical, surgical, family and social history reviewed and updated as indicated. Interim medical history since our last visit reviewed. Allergies and medications reviewed and updated.  Review of Systems  Constitutional: Negative for appetite change, chills, diaphoresis, fatigue, fever and unexpected weight change.  HENT: Positive for dental problem and hearing loss. Negative for congestion, drooling, ear pain, facial swelling, mouth sores, sneezing, sore throat, trouble swallowing and voice change.   Eyes: Negative for pain, discharge, redness, itching and visual disturbance.  Respiratory: Positive for cough. Negative for choking, shortness of breath and wheezing.   Cardiovascular: Negative for chest pain, palpitations and leg swelling.  Gastrointestinal: Negative for abdominal pain, blood in stool, constipation, diarrhea and vomiting.  Endocrine: Negative for cold intolerance, heat intolerance and polydipsia.  Genitourinary: Negative for decreased urine volume, dysuria and hematuria.  Musculoskeletal: Negative for arthralgias, back pain and gait problem.  Skin: Negative for rash.  Allergic/Immunologic: Negative for environmental allergies.  Neurological: Negative for seizures, syncope, light-headedness and headaches.  Hematological: Negative for adenopathy.  Psychiatric/Behavioral: Positive for dysphoric mood. Negative for agitation and suicidal ideas. The patient is nervous/anxious.     Per HPI unless specifically indicated above     Objective:    BP 106/60   Pulse 79   Temp (!) 97.5 F (36.4 C)   SpO2 94%   Wt  Readings from Last 3 Encounters:  09/12/18 128 lb 8 oz (58.3 kg)  04/26/18 110 lb (49.9 kg)  02/03/18 115 lb (52.2 kg)    Physical Exam Vitals signs reviewed.  Constitutional:      Appearance: She is well-developed.  HENT:     Head: Normocephalic and atraumatic.  Neck:     Musculoskeletal: Neck supple.  Cardiovascular:     Rate and Rhythm: Normal rate and regular rhythm.  Pulmonary:     Effort: Pulmonary effort is normal.     Breath sounds: Normal breath sounds.  Abdominal:     General: Bowel sounds are normal.     Palpations: Abdomen is soft. There is no mass.     Tenderness: There is no abdominal tenderness.  Lymphadenopathy:     Cervical: No cervical adenopathy.  Skin:    General: Skin is warm and dry.  Neurological:     Mental Status: She is alert and oriented to person, place, and time.  Psychiatric:        Behavior: Behavior normal.     Results for orders placed or performed during the hospital encounter of 10/01/18  Lipid panel  Result Value Ref Range   Cholesterol 131 0 - 200 mg/dL   Triglycerides 70 <323 mg/dL   HDL 38 (L) >55 mg/dL   Total CHOL/HDL Ratio 3.4 RATIO   VLDL 14 0 - 40 mg/dL   LDL Cholesterol 79 0 - 99 mg/dL  Comprehensive metabolic panel  Result Value Ref Range   Sodium 139 135 - 145 mmol/L   Potassium 4.1 3.5 - 5.1 mmol/L   Chloride 106 98 - 111 mmol/L   CO2 27  22 - 32 mmol/L   Glucose, Bld 86 70 - 99 mg/dL   BUN 10 6 - 20 mg/dL   Creatinine, Ser 6.38 0.44 - 1.00 mg/dL   Calcium 9.1 8.9 - 93.7 mg/dL   Total Protein 7.4 6.5 - 8.1 g/dL   Albumin 3.6 3.5 - 5.0 g/dL   AST 23 15 - 41 U/L   ALT 18 0 - 44 U/L   Alkaline Phosphatase 98 38 - 126 U/L   Total Bilirubin 0.3 0.3 - 1.2 mg/dL   GFR calc non Af Amer >60 >60 mL/min   GFR calc Af Amer >60 >60 mL/min   Anion gap 6 5 - 15  CBC  Result Value Ref Range   WBC 5.6 4.0 - 10.5 K/uL   RBC 4.42 3.87 - 5.11 MIL/uL   Hemoglobin 12.7 12.0 - 15.0 g/dL   HCT 34.2 87.6 - 81.1 %   MCV 87.1 80.0  - 100.0 fL   MCH 28.7 26.0 - 34.0 pg   MCHC 33.0 30.0 - 36.0 g/dL   RDW 57.2 62.0 - 35.5 %   Platelets 264 150 - 400 K/uL   nRBC 0.0 0.0 - 0.2 %      Assessment & Plan:    Encounter Diagnoses  Name Primary?  . Tobacco use disorder Yes  . Mood disorder (HCC)     Reviewed labs with pt -encouraged pt to get to [facility] for MH issues -pt to follow up  1 yr.  RTO sooner prn

## 2018-10-02 LAB — HEPATITIS PANEL, ACUTE
HCV Ab: <0.1 s/co ratio (ref 0.0–0.9)
HEP A IGM: NEGATIVE
Hep B C IgM: NEGATIVE
Hepatitis B Surface Ag: NEGATIVE

## 2018-11-27 IMAGING — DX DG CHEST 2V
2 series · 2 of 2 positions shown · non-contrast
Comparison: None.

CLINICAL DATA: Productive cough and fever for several days.

EXAM:
CHEST - 2 VIEW

[chest pa]
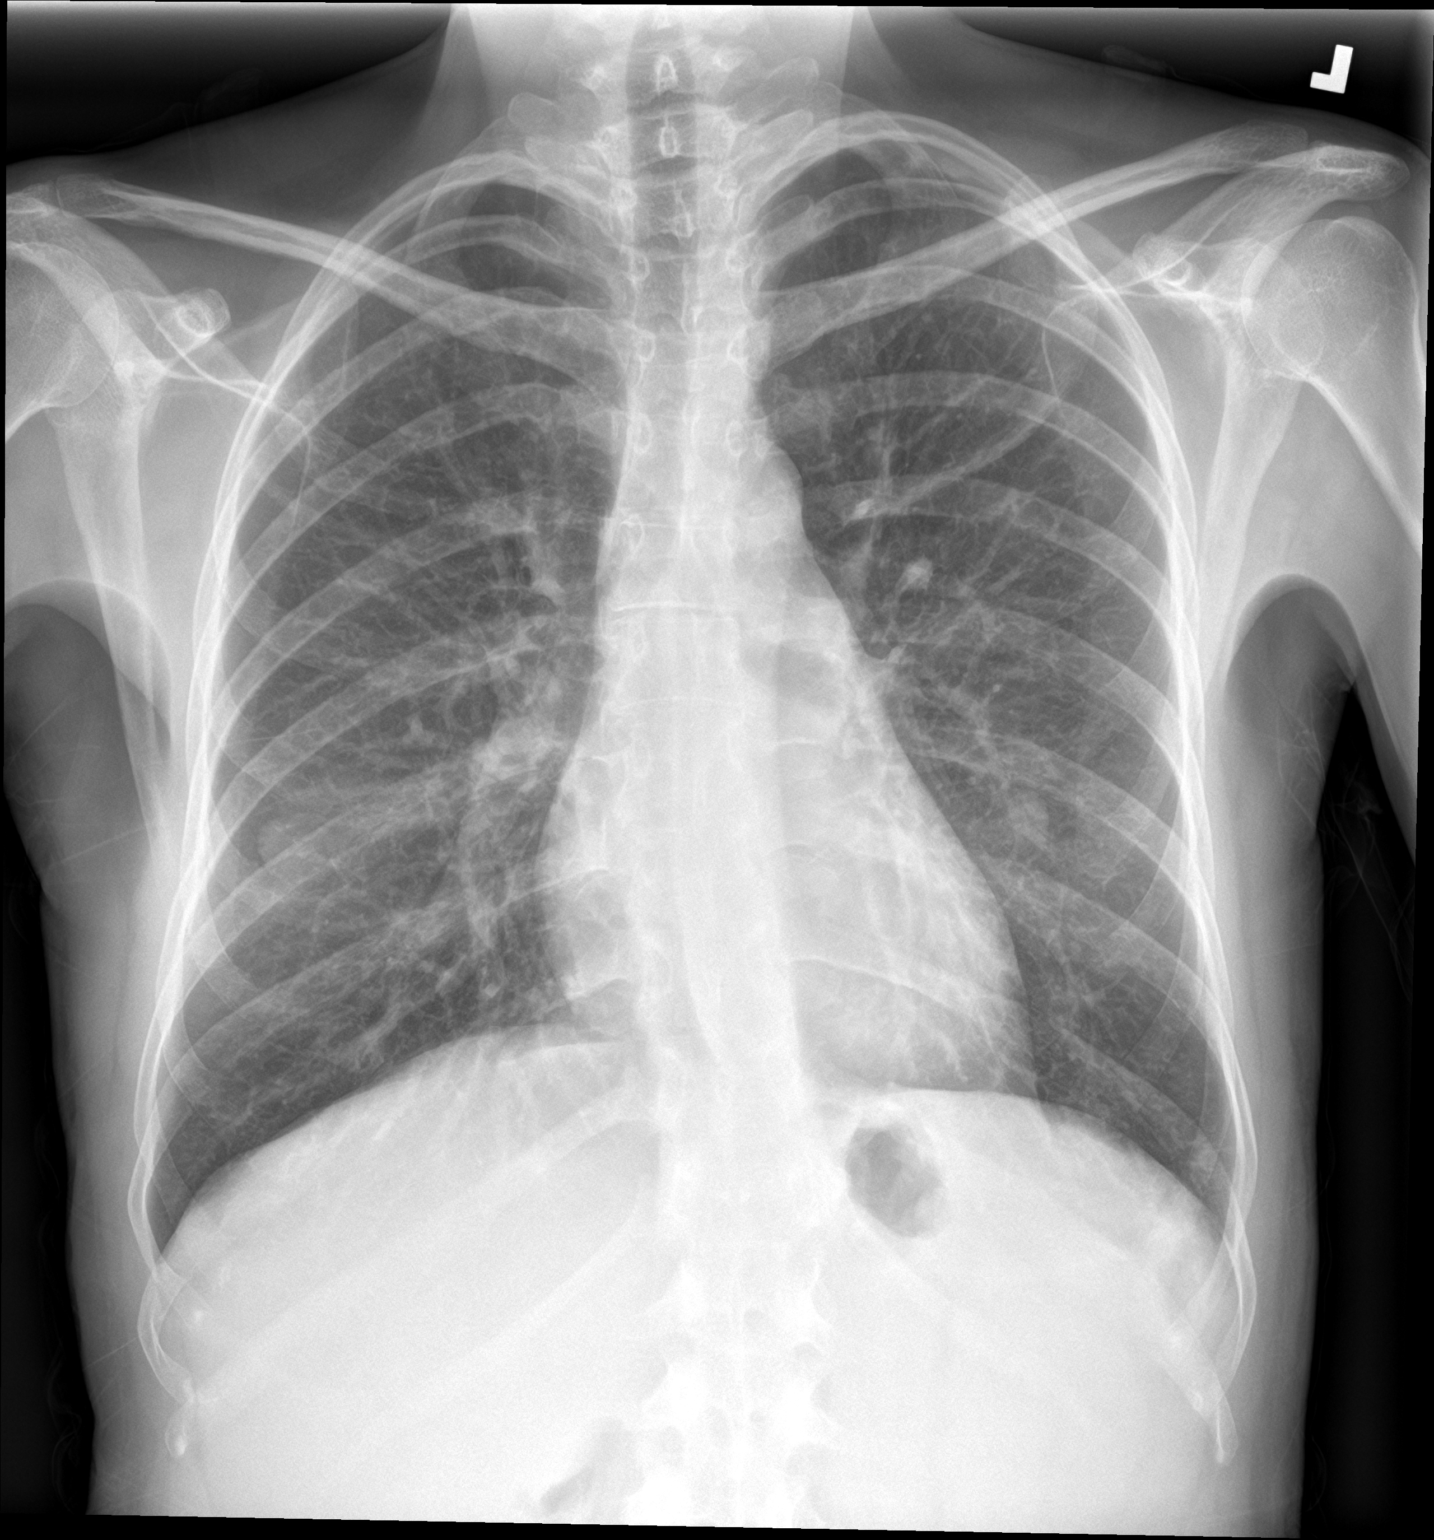

[chest lat]
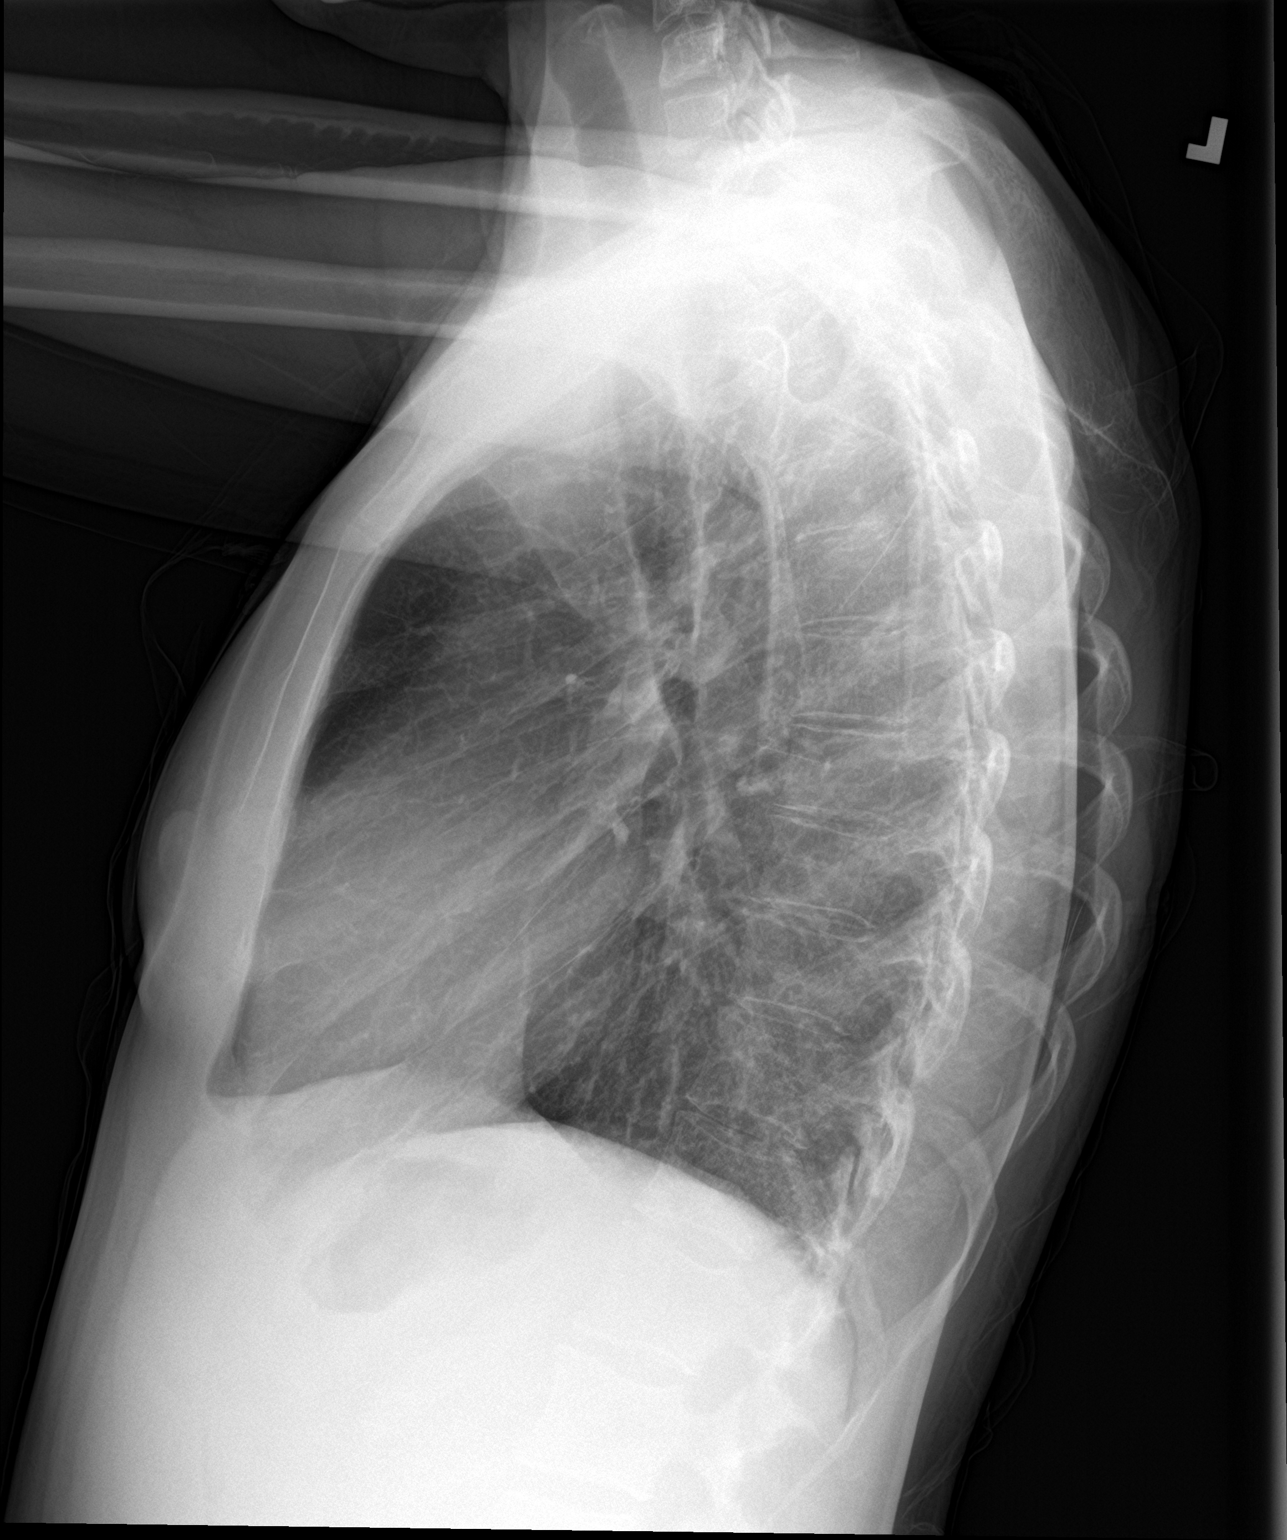

[2 of 2 positions shown; findings below may reference images not displayed]

FINDINGS: The heart size and mediastinal contours are within normal limits.
Both lungs are clear. Nipple shadows incidentally noted. The
visualized skeletal structures are unremarkable.
IMPRESSION: No active cardiopulmonary disease.

## 2019-05-26 ENCOUNTER — Ambulatory Visit: Payer: Self-pay | Admitting: Physician Assistant

## 2019-05-26 DIAGNOSIS — L299 Pruritus, unspecified: Secondary | ICD-10-CM

## 2019-05-26 MED ORDER — PREDNISONE 20 MG PO TABS: 20.0000 mg | ORAL_TABLET | Freq: Two times a day (BID) | ORAL | 0 refills | Status: AC

## 2019-05-26 MED ORDER — HYDROXYZINE HCL 10 MG PO TABS: 10.0000 mg | ORAL_TABLET | Freq: Three times a day (TID) | ORAL | 0 refills | Status: AC | PRN

## 2019-05-26 NOTE — Progress Notes (Signed)
   There were no vitals taken for this visit.   Subjective:    Patient ID: Brenda Keller, female    DOB: 04/13/1986, 33 y.o.   MRN: [id number]  HPI: Brenda Keller is a 33 y.o. female presenting on 05/26/2019 for No chief complaint on file.   HPI   This is a telemedicine appointment through [employer] due to coronavirus pandemic.  I connected with  Ineze N Johnston on 06/13/19 by a video enabled telemedicine application and verified that I am speaking with the correct person using two identifiers.   I discussed the limitations of evaluation and management by telemedicine. The patient expressed understanding and agreed to proceed.  Pt is in her car outside her place of employment.  Provider is at office.    Pt says she is currenlty Homeless and has been living in her car  [place] [place].  Since that time, she has had "Itching all over" that she says feels like something stinging her. In scalp, privates, eyes.   She says she has No rash except in crease of legs/groin.  She says she Has "little black dots" on her clothes but wonders if that is her fingernail polish flaking off as she has been picking at it.  She took several showers to make it go away.  She says it hasn't helped.  Feeling itchy for past 2 day.      Relevant past medical, surgical, family and social history reviewed and updated as indicated. Interim medical history since our last visit reviewed. Allergies and medications reviewed and updated.  Review of Systems  Per HPI unless specifically indicated above     Objective:    There were no vitals taken for this visit.  Wt Readings from Last 3 Encounters:  09/12/18 128 lb 8 oz (58.3 kg)  04/26/18 110 lb (49.9 kg)  02/03/18 115 lb (52.2 kg)    Physical Exam Constitutional:      General: She is not in acute distress.    Appearance: Normal appearance. She is not ill-appearing.  HENT:     Head: Normocephalic and atraumatic.   Pulmonary:     Effort: Pulmonary effort is normal. No respiratory distress.  Skin:    Findings: No rash.     Comments: No rash is seen on exposed skin.  Unable to examine groin area due to pt being in her car.    Neurological:     Mental Status: She is alert and oriented to person, place, and time.  Psychiatric:        Attention and Perception: Attention normal.        Speech: Speech normal.        Behavior: Behavior is cooperative.         Assessment & Plan:    Encounter Diagnosis  Name Primary?  . Pruritus Yes     -recommended pt use mild soap like Dove.   She can apply emollient like eucerin.  She is encouraged to avoid showering No more than 1 shower/day as that could further dry the skin and increasing itching. -rx atarax and prednisone -pt is to Contact office if symptoms persist or if new symptoms develop.

## 2019-06-13 ENCOUNTER — Encounter: Payer: Self-pay | Admitting: Physician Assistant

## 2019-10-01 ENCOUNTER — Ambulatory Visit: Payer: Self-pay | Admitting: Physician Assistant

## 2022-04-06 ENCOUNTER — Encounter: Payer: Self-pay | Admitting: Emergency Medicine

## 2022-04-06 ENCOUNTER — Ambulatory Visit: Payer: Self-pay

## 2022-04-06 ENCOUNTER — Ambulatory Visit
Admission: EM | Admit: 2022-04-06 | Discharge: 2022-04-06 | Disposition: A | Discharge status: Home / Self Care | Payer: Medicaid Other | Attending: Nurse Practitioner | Admitting: Nurse Practitioner

## 2022-04-06 DIAGNOSIS — Z3201 Encounter for pregnancy test, result positive: Secondary | ICD-10-CM

## 2022-04-06 DIAGNOSIS — N898 Other specified noninflammatory disorders of vagina: Secondary | ICD-10-CM

## 2022-04-06 DIAGNOSIS — Z113 Encounter for screening for infections with a predominantly sexual mode of transmission: Secondary | ICD-10-CM | POA: Diagnosis not present

## 2022-04-06 LAB — POCT URINE PREGNANCY: Preg Test, Ur: POSITIVE — AB

## 2022-04-06 NOTE — ED Provider Notes (Signed)
[facility]    CSN: [id number] Arrival date & time: 04/06/22  1409      History   Chief Complaint No chief complaint on file.   HPI Brenda Keller is a 36 y.o. female.   The history is provided by the patient.   Patient presents for pregnancy test to confirm pregnancy and for vaginal discharge.  Patient states her last menstrual cycle was on 01/05/2022.  Patient states that her vaginal discharge has been present for the past month.  She describes the discharge as milky, and has an odor.  Patient reports that she has had nausea over the past several weeks and breast tenderness.  She reports 1 female partner over the past 90 days.  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   Staphylococcus aureus bacteremia with sepsis ([place]) 11/25/2014   Nicotine dependence 11/25/2014   Sepsis ([place]) 11/23/2014   Fever 11/23/2014   Opiate dependence ([place]) 06/26/2013    Past Surgical History:  Procedure Laterality Date   MOUTH SURGERY     TEE WITHOUT CARDIOVERSION N/A 11/26/2014   Procedure: TRANSESOPHAGEAL ECHOCARDIOGRAM (TEE) WITH PROPOFOL;  Surgeon: Herminio Commons, MD;  Location: [facility];  Service: Cardiology;  Laterality: N/A;    OB History     Gravida  2   Para  2   Term  2   Preterm      AB      Living  2      SAB      IAB      Ectopic      Multiple      Live Births               Home Medications    Prior to Admission medications   Medication Sig Start Date End Date Taking? Authorizing Provider  Etonogestrel [facility]) Inject into the skin once.     [provider]  hydrOXYzine (ATARAX/VISTARIL) 10 MG tablet Take 1 tablet (10 mg total) by mouth 3 (three) times daily as needed for itching. 05/26/19   Soyla Dryer, PA-C   METHADONE HCL PO Take 100 mg by mouth daily.    [provider]  predniSONE (DELTASONE) 20 MG tablet Take 1 tablet (20 mg total) by mouth 2 (two) times daily with a meal. 05/26/19   Soyla Dryer, PA-C  promethazine (PHENERGAN) 25 MG tablet Take 1 tablet (25 mg total) by mouth every 8 (eight) hours as needed for nausea or vomiting. Patient not taking: Reported on 10/01/2018 09/24/18   Soyla Dryer, PA-C    Family History Family History  Problem Relation Age of Onset   Hypertension Father    Cancer Maternal Uncle        gleoglastoma   Cancer Paternal Uncle        nose cancer   Diabetes Maternal Grandmother    Cancer Maternal Grandmother        breast cancer   Thyroid disease Maternal Grandmother    Hypertension Maternal Grandmother    Diabetes Maternal Grandfather    Diabetes Paternal Grandmother    Cancer Paternal Grandmother        throat   Diabetes Paternal Grandfather     Social History Social History   Tobacco Use   Smoking status: Every  Day    Packs/day: 0.50    Years: 15.00    Total pack years: 7.50    Types: Cigarettes   Smokeless tobacco: Never  Vaping Use   Vaping Use: Never used  Substance Use Topics   Alcohol use: Not Currently    Comment: hx etoh   Drug use: Yes    Types: IV, Methamphetamines    Comment: last used january 2020.  nothing else since oct 2019     Allergies   Coconut flavor and Hydrocodone   Review of Systems Review of Systems HPI  Physical Exam Triage Vital Signs ED Triage Vitals  Enc Vitals Group     BP 04/06/22 1416 117/75     Pulse Rate 04/06/22 1416 (!) 111     Resp 04/06/22 1416 16     Temp 04/06/22 1416 98 F (36.7 C)     Temp Source 04/06/22 1416 Oral     SpO2 04/06/22 1416 98 %     Weight --      Height --      Head Circumference --      Peak Flow --      Pain Score 04/06/22 1418 0     Pain Loc --      Pain Edu? --      Excl. in GC? --    No data found.  Updated Vital Signs BP 117/75 (BP  Location: Right Arm)   Pulse (!) 111   Temp 98 F (36.7 C) (Oral)   Resp 16   LMP 01/05/2022 (Exact Date)   SpO2 98%   Visual Acuity Right Eye Distance:   Left Eye Distance:   Bilateral Distance:    Right Eye Near:   Left Eye Near:    Bilateral Near:     Physical Exam Vitals and nursing note reviewed.  Constitutional:      General: She is not in acute distress.    Appearance: Normal appearance.  HENT:     Head: Normocephalic.     Nose: Nose normal.     Mouth/Throat:     Mouth: Mucous membranes are moist.  Eyes:     Extraocular Movements: Extraocular movements intact.     Conjunctiva/sclera: Conjunctivae normal.     Pupils: Pupils are equal, round, and reactive to light.  Cardiovascular:     Rate and Rhythm: Regular rhythm.     Pulses: Normal pulses.     Heart sounds: Normal heart sounds.  Pulmonary:     Effort: Pulmonary effort is normal. No respiratory distress.     Breath sounds: Normal breath sounds. No stridor. No wheezing, rhonchi or rales.  Abdominal:     General: Bowel sounds are normal.     Palpations: Abdomen is soft.     Tenderness: There is no abdominal tenderness.  Musculoskeletal:     Cervical back: Normal range of motion.  Lymphadenopathy:     Cervical: No cervical adenopathy.  Skin:    General: Skin is warm and dry.  Neurological:     General: No focal deficit present.     Mental Status: She is alert and oriented to person, place, and time.  Psychiatric:        Mood and Affect: Mood normal.        Behavior: Behavior normal.      UC Treatments / Results  Labs (all labs ordered are listed, but only abnormal results are displayed) Labs Reviewed  POCT URINE PREGNANCY - Abnormal; Notable for the following  components:      Result Value   Preg Test, Ur Positive (*)    All other components within normal limits  CERVICOVAGINAL ANCILLARY ONLY    EKG   Radiology No results found.  Procedures Procedures (including critical care  time)  Medications Ordered in UC Medications - No data to display  Initial Impression / Assessment and Plan / UC Course  I have reviewed the triage vital signs and the nursing notes.  Pertinent labs & imaging results that were available during my care of the patient were reviewed by me and considered in my medical decision making (see chart for details).  Patient presents for pregnancy test and for vaginal discharge.  Patient's pregnancy test is positive.  Cytology test is pending.  Discussed with patient the importance of waiting on the cytology result with in light of the positive pregnancy test.  Patient was in agreement with this plan.  Patient was provided information for [facility].  Patient was advised that she will be contacted if the cytology results are positive.  Patient verbalizes understanding.  All questions were answered. Final Clinical Impressions(s) / UC Diagnoses   Final diagnoses:  None   Discharge Instructions   None    ED Prescriptions   None    PDMP not reviewed this encounter.   Abran Cantor, NP 04/06/22 1513

## 2022-04-06 NOTE — ED Triage Notes (Signed)
Wants to verify that she is pregnant.  States she is having a milky discharge with an odor.

## 2022-04-06 NOTE — Discharge Instructions (Signed)
Your cytology results are pending.  You will be contacted if the results of your test are positive to discuss treatment. Recommend refraining from sexual activity until you receive your test results.  If your test results are positive, you will need to refrain from sexual activity for 7 days after completing treatment. As discussed, if your cytology results are negative and you are continuing to experience symptoms, recommend following up with gynecology.  Please contact [facility] at [phone] for further evaluation regarding your pregnancy. Follow-up as needed.

## 2022-04-09 LAB — CERVICOVAGINAL ANCILLARY ONLY
Bacterial Vaginitis (gardnerella): POSITIVE — AB
Candida Glabrata: POSITIVE — AB
Candida Vaginitis: POSITIVE — AB
Chlamydia: NEGATIVE
Comment: NEGATIVE
Comment: NEGATIVE
Comment: NEGATIVE
Comment: NEGATIVE
Comment: NEGATIVE
Comment: NORMAL
Neisseria Gonorrhea: NEGATIVE
Trichomonas: POSITIVE — AB

## 2022-04-11 ENCOUNTER — Telehealth (HOSPITAL_COMMUNITY): Payer: Self-pay

## 2022-04-11 MED ORDER — METRONIDAZOLE 500 MG PO TABS: 500.0000 mg | ORAL_TABLET | Freq: Two times a day (BID) | ORAL | 0 refills | Status: AC

## 2022-04-11 MED ORDER — CLOTRIMAZOLE 1 % VA CREA: 1.0000 | TOPICAL_CREAM | Freq: Every day | VAGINAL | 0 refills | Status: AC

## 2022-04-24 ENCOUNTER — Other Ambulatory Visit: Payer: Self-pay | Admitting: Obstetrics & Gynecology

## 2022-04-24 DIAGNOSIS — O3680X0 Pregnancy with inconclusive fetal viability, not applicable or unspecified: Secondary | ICD-10-CM

## 2022-04-25 ENCOUNTER — Other Ambulatory Visit: Payer: Medicaid Other

## 2022-05-03 ENCOUNTER — Encounter: Payer: Medicaid Other | Admitting: Radiology

## 2022-05-22 ENCOUNTER — Ambulatory Visit: Payer: Medicaid Other | Admitting: Family Medicine

## 2022-07-20 ENCOUNTER — Other Ambulatory Visit: Payer: Self-pay

## 2022-07-20 ENCOUNTER — Encounter: Payer: Self-pay | Admitting: Emergency Medicine

## 2022-07-20 ENCOUNTER — Ambulatory Visit
Admission: EM | Admit: 2022-07-20 | Discharge: 2022-07-20 | Disposition: A | Discharge status: Home / Self Care | Payer: Medicaid Other | Attending: Family Medicine | Admitting: Family Medicine

## 2022-07-20 DIAGNOSIS — R1013 Epigastric pain: Secondary | ICD-10-CM | POA: Insufficient documentation

## 2022-07-20 DIAGNOSIS — N390 Urinary tract infection, site not specified: Secondary | ICD-10-CM | POA: Insufficient documentation

## 2022-07-20 LAB — POCT URINALYSIS DIP (MANUAL ENTRY)
Glucose, UA: NEGATIVE mg/dL
Nitrite, UA: POSITIVE — AB
Protein Ur, POC: 30 mg/dL — AB
Spec Grav, UA: 1.025 (ref 1.010–1.025)
Urobilinogen, UA: 0.2 E.U./dL
pH, UA: 6.5 (ref 5.0–8.0)

## 2022-07-20 MED ORDER — PANTOPRAZOLE SODIUM 40 MG PO TBEC: 40.0000 mg | DELAYED_RELEASE_TABLET | Freq: Every day | ORAL | 0 refills | Status: AC

## 2022-07-20 MED ORDER — ALUM & MAG HYDROXIDE-SIMETH 200-200-20 MG/5ML PO SUSP
30.0000 mL | Freq: Once | ORAL | Status: AC
  Administered 2022-07-20: 30 mL via ORAL

## 2022-07-20 MED ORDER — LIDOCAINE VISCOUS HCL 2 % MT SOLN
15.0000 mL | Freq: Once | OROMUCOSAL | Status: AC
  Administered 2022-07-20: 15 mL via OROMUCOSAL

## 2022-07-20 MED ORDER — CEPHALEXIN 500 MG PO CAPS: 500.0000 mg | ORAL_CAPSULE | Freq: Two times a day (BID) | ORAL | 0 refills | Status: AC

## 2022-07-20 MED ORDER — SUCRALFATE 1 G PO TABS: 1.0000 g | ORAL_TABLET | Freq: Three times a day (TID) | ORAL | 0 refills | Status: AC | PRN

## 2022-07-20 NOTE — ED Triage Notes (Signed)
Pt reports epigastric pain that started 3 days ago and reports pain goes away and comes back. Pt reports belches and feels a "little better" but discomfort shortly returns. LBM x2 days.

## 2022-07-20 NOTE — ED Provider Notes (Signed)
[facility]    CSN: [id number] Arrival date & time: 07/20/22  0857      History   Chief Complaint Chief Complaint  Patient presents with   Abdominal Pain    HPI Brenda Keller is a 37 y.o. female.   Pt reports epigastric pain that started 3 days ago and reports pain goes away and comes back. Pt reports belches and feels a "little better" but discomfort shortly returns. LBM x2 days.       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   Staphylococcus aureus bacteremia with sepsis ([place]) 11/25/2014   Nicotine dependence 11/25/2014   Sepsis ([place]) 11/23/2014   Fever 11/23/2014   Opiate dependence ([place]) 06/26/2013    Past Surgical History:  Procedure Laterality Date   MOUTH SURGERY     TEE WITHOUT CARDIOVERSION N/A 11/26/2014   Procedure: TRANSESOPHAGEAL ECHOCARDIOGRAM (TEE) WITH PROPOFOL;  Surgeon: Herminio Commons, MD;  Location: [facility];  Service: Cardiology;  Laterality: N/A;    OB History     Gravida  3   Para  2   Term  2   Preterm      AB  1   Living  2      SAB  1   IAB      Ectopic      Multiple      Live Births               Home Medications    Prior to Admission medications   Medication Sig Start Date End Date Taking? Authorizing Provider  cephALEXin (KEFLEX) 500 MG capsule Take 1 capsule (500 mg total) by mouth 2 (two) times daily. 07/20/22  Yes Volney American, PA-C  pantoprazole (PROTONIX) 40 MG tablet Take 1 tablet (40 mg total) by mouth daily before breakfast. 07/20/22  Yes Volney American, PA-C  sucralfate (CARAFATE) 1 g tablet Take 1 tablet (1 g total) by mouth 3 (three) times daily as needed. May dissolve 1 tab into glass of water and drink 3 times daily as needed prior to meals 07/20/22  Yes Volney American, PA-C  clotrimazole (GYNE-LOTRIMIN) 1 % vaginal cream Place 1 Applicatorful vaginally at bedtime. 04/11/22   Lamptey, Myrene Galas, MD  Etonogestrel [facility]) Inject into the skin once.     [provider]  hydrOXYzine (ATARAX/VISTARIL) 10 MG tablet Take 1 tablet (10 mg total) by mouth 3 (three) times daily as needed for itching. 05/26/19   Soyla Dryer, PA-C  METHADONE HCL PO Take 100 mg by mouth daily.    [provider]  metroNIDAZOLE (FLAGYL) 500 MG tablet Take 1 tablet (500 mg total) by mouth 2 (two) times daily. 04/11/22   Lamptey, Myrene Galas, MD  predniSONE (DELTASONE) 20 MG tablet Take 1 tablet (20 mg total) by mouth 2 (two) times daily with a meal. 05/26/19   Soyla Dryer, PA-C  promethazine (PHENERGAN) 25 MG tablet Take 1 tablet (25 mg total) by mouth every 8 (eight) hours as needed for nausea or vomiting. Patient not taking: Reported on 10/01/2018 09/24/18   Soyla Dryer, PA-C    Family History Family History  Problem Relation Age of Onset   Hypertension Father    Cancer Maternal Uncle  gleoglastoma   Cancer Paternal Uncle        nose cancer   Diabetes Maternal Grandmother    Cancer Maternal Grandmother        breast cancer   Thyroid disease Maternal Grandmother    Hypertension Maternal Grandmother    Diabetes Maternal Grandfather    Diabetes Paternal Grandmother    Cancer Paternal Grandmother        throat   Diabetes Paternal Grandfather     Social History Social History   Tobacco Use   Smoking status: Every Day    Packs/day: 0.50    Years: 15.00    Total pack years: 7.50    Types: Cigarettes   Smokeless tobacco: Never  Vaping Use   Vaping Use: Never used  Substance Use Topics   Alcohol use: Not Currently    Comment: hx etoh   Drug use: Yes    Types: IV, Methamphetamines    Comment: last used january 2020.  nothing else since oct 2019     Allergies   Coconut flavor and Hydrocodone   Review of Systems Review of  Systems PER HPI  Physical Exam Triage Vital Signs ED Triage Vitals  Enc Vitals Group     BP 07/20/22 0912 110/77     Pulse Rate 07/20/22 0912 95     Resp 07/20/22 0912 14     Temp 07/20/22 0912 97.7 F (36.5 C)     Temp Source 07/20/22 0912 Oral     SpO2 07/20/22 0912 98 %     Weight --      Height --      Head Circumference --      Peak Flow --      Pain Score 07/20/22 0927 10     Pain Loc --      Pain Edu? --      Excl. in GC? --    No data found.  Updated Vital Signs BP 110/77 (BP Location: Right Arm)   Pulse 95   Temp 97.7 F (36.5 C) (Oral)   Resp 14   SpO2 98%   Visual Acuity Right Eye Distance:   Left Eye Distance:   Bilateral Distance:    Right Eye Near:   Left Eye Near:    Bilateral Near:     Physical Exam Vitals and nursing note reviewed.  Constitutional:      Appearance: Normal appearance. She is not ill-appearing.  HENT:     Head: Atraumatic.     Mouth/Throat:     Mouth: Mucous membranes are moist.  Eyes:     Extraocular Movements: Extraocular movements intact.     Conjunctiva/sclera: Conjunctivae normal.  Cardiovascular:     Rate and Rhythm: Normal rate and regular rhythm.     Heart sounds: Normal heart sounds.  Pulmonary:     Effort: Pulmonary effort is normal.     Breath sounds: Normal breath sounds.  Abdominal:     General: Bowel sounds are normal. There is no distension.     Palpations: Abdomen is soft. There is no mass.     Tenderness: There is abdominal tenderness. There is no right CVA tenderness, left CVA tenderness, guarding or rebound.  Musculoskeletal:        General: Normal range of motion.     Cervical back: Normal range of motion and neck supple.  Skin:    General: Skin is warm and dry.  Neurological:     Mental Status: She is alert and oriented  to person, place, and time.  Psychiatric:        Mood and Affect: Mood normal.        Thought Content: Thought content normal.        Judgment: Judgment normal.      UC  Treatments / Results  Labs (all labs ordered are listed, but only abnormal results are displayed) Labs Reviewed  POCT URINALYSIS DIP (MANUAL ENTRY) - Abnormal; Notable for the following components:      Result Value   Bilirubin, UA small (*)    Ketones, POC UA trace (5) (*)    Blood, UA trace-intact (*)    Protein Ur, POC =30 (*)    Nitrite, UA Positive (*)    Leukocytes, UA Small (1+) (*)    All other components within normal limits  URINE CULTURE    EKG   Radiology No results found.  Procedures Procedures (including critical care time)  Medications Ordered in UC Medications  lidocaine (XYLOCAINE) 2 % viscous mouth solution 15 mL (15 mLs Mouth/Throat Given 07/20/22 0951)  alum & mag hydroxide-simeth (MAALOX/MYLANTA) 200-200-20 MG/5ML suspension 30 mL (30 mLs Oral Given 07/20/22 0951)    Initial Impression / Assessment and Plan / UC Course  I have reviewed the triage vital signs and the nursing notes.  Pertinent labs & imaging results that were available during my care of the patient were reviewed by me and considered in my medical decision making (see chart for details).     Suspect a gastritis type cause of her upper abdominal pain.  GI cocktail given in clinic, treat with Protonix, Carafate, bland foods, fluids.  Incidental finding of urinary tract infection and workup today of, urine culture pending, treat with Keflex and adjust if needed.  Return for worsening symptoms.  Final Clinical Impressions(s) / UC Diagnoses   Final diagnoses:  Acute lower UTI  Abdominal pain, epigastric   Discharge Instructions   None    ED Prescriptions     Medication Sig Dispense Auth. Provider   cephALEXin (KEFLEX) 500 MG capsule Take 1 capsule (500 mg total) by mouth 2 (two) times daily. 10 capsule Volney American, PA-C   pantoprazole (PROTONIX) 40 MG tablet Take 1 tablet (40 mg total) by mouth daily before breakfast. 30 tablet Volney American, PA-C   sucralfate  (CARAFATE) 1 g tablet Take 1 tablet (1 g total) by mouth 3 (three) times daily as needed. May dissolve 1 tab into glass of water and drink 3 times daily as needed prior to meals 90 tablet Volney American, [place]      PDMP not reviewed this encounter.   Volney American, [place] 07/20/22 1004

## 2022-07-21 LAB — URINE CULTURE

## 2022-07-23 LAB — URINE CULTURE: Culture: 50000 — AB

## 2022-08-01 ENCOUNTER — Telehealth: Payer: Medicaid Other | Admitting: Physician Assistant

## 2022-08-01 DIAGNOSIS — N939 Abnormal uterine and vaginal bleeding, unspecified: Secondary | ICD-10-CM

## 2022-08-01 NOTE — Progress Notes (Signed)
Because of bleeding that we cannot fully assess via an e-visit, I feel your condition warrants further evaluation and I recommend that you be seen in a face to face visit.   NOTE: There will be NO CHARGE for this eVisit   If you are having a true medical emergency please call 911.      For an urgent face to face visit, [facility] has eight urgent care centers for your convenience:   NEW!! [place] [facility] [place] Get Driving Directions [phone] [address] [place], [place] [facility] [place] [facility] [place] Get Driving Directions [phone] [place], [place] [address]   [place] [facility]) Get Driving Directions [phone] [address], [place] [address]  [place] [facility] [place] ([place]) Get Driving Directions [phone] [address] [place],  [place]  [address]  [place] [facility] [place] [facility] at [address] [place] Get Driving Directions  [phone] [address] [place],  [place] [address]   [place] [facility] Get Driving Directions [phone] [place], [address] [place], [place] [address]   [facility] Get Driving Directions  [phone] [address] [place], [place] [address]   [facility] [place] Get Driving Directions [phone] [address], [place], [place] [address]  Your MyChart E-visit questionnaire answers were reviewed by a board certified advanced clinical practitioner to complete your personal care plan based on your specific symptoms.  Thank you for using e-Visits.

## 2022-08-02 ENCOUNTER — Ambulatory Visit: Payer: Self-pay

## 2022-08-04 ENCOUNTER — Telehealth: Payer: Medicaid Other

## 2022-08-04 ENCOUNTER — Encounter: Payer: Self-pay | Admitting: Nurse Practitioner

## 2022-08-10 ENCOUNTER — Encounter: Payer: Medicaid Other | Admitting: Obstetrics and Gynecology

## 2022-08-11 ENCOUNTER — Encounter: Payer: Medicaid Other | Admitting: Obstetrics and Gynecology

## 2022-08-22 ENCOUNTER — Ambulatory Visit: Payer: Medicaid Other | Admitting: Podiatry

## 2022-09-05 ENCOUNTER — Encounter: Payer: Medicaid Other | Admitting: Nurse Practitioner
